# Patient Record
Sex: Female | Born: 1955 | Race: White | Hispanic: No | Marital: Single | Smoking: Current some day smoker
Health system: Southern US, Community
[De-identification: ages and names within clinical notes are randomized; demographics above are authoritative.]

## PROBLEM LIST (undated history)

## (undated) DIAGNOSIS — H5462 Unqualified visual loss, left eye, normal vision right eye: Secondary | ICD-10-CM

## (undated) DIAGNOSIS — G43909 Migraine, unspecified, not intractable, without status migrainosus: Secondary | ICD-10-CM

## (undated) DIAGNOSIS — K859 Acute pancreatitis without necrosis or infection, unspecified: Secondary | ICD-10-CM

## (undated) DIAGNOSIS — R9431 Abnormal electrocardiogram [ECG] [EKG]: Secondary | ICD-10-CM

## (undated) DIAGNOSIS — K5792 Diverticulitis of intestine, part unspecified, without perforation or abscess without bleeding: Secondary | ICD-10-CM

## (undated) DIAGNOSIS — E278 Other specified disorders of adrenal gland: Secondary | ICD-10-CM

## (undated) DIAGNOSIS — I10 Essential (primary) hypertension: Secondary | ICD-10-CM

## (undated) HISTORY — DX: Diverticulitis of intestine, part unspecified, without perforation or abscess without bleeding: K57.92

## (undated) HISTORY — DX: Acute pancreatitis without necrosis or infection, unspecified: K85.90

## (undated) HISTORY — DX: Migraine, unspecified, not intractable, without status migrainosus: G43.909

## (undated) HISTORY — PX: TONSILLECTOMY AND ADENOIDECTOMY: SUR1326

## (undated) HISTORY — DX: Abnormal electrocardiogram (ECG) (EKG): R94.31

## (undated) HISTORY — DX: Unqualified visual loss, left eye, normal vision right eye: H54.62

## (undated) HISTORY — DX: Other specified disorders of adrenal gland: E27.8

## (undated) HISTORY — DX: Essential (primary) hypertension: I10

---

## 1999-06-30 HISTORY — PX: TOTAL ABDOMINAL HYSTERECTOMY W/ BILATERAL SALPINGOOPHORECTOMY: SHX83

## 2006-12-17 ENCOUNTER — Encounter: Payer: Self-pay | Admitting: Internal Medicine

## 2007-05-09 ENCOUNTER — Ambulatory Visit: Payer: Self-pay | Admitting: Internal Medicine

## 2007-05-09 DIAGNOSIS — R9431 Abnormal electrocardiogram [ECG] [EKG]: Secondary | ICD-10-CM | POA: Insufficient documentation

## 2007-05-09 DIAGNOSIS — R519 Headache, unspecified: Secondary | ICD-10-CM | POA: Insufficient documentation

## 2007-05-09 DIAGNOSIS — I10 Essential (primary) hypertension: Secondary | ICD-10-CM | POA: Insufficient documentation

## 2007-05-09 DIAGNOSIS — R51 Headache: Secondary | ICD-10-CM | POA: Insufficient documentation

## 2007-05-09 DIAGNOSIS — Z9089 Acquired absence of other organs: Secondary | ICD-10-CM | POA: Insufficient documentation

## 2007-05-09 DIAGNOSIS — F17200 Nicotine dependence, unspecified, uncomplicated: Secondary | ICD-10-CM | POA: Insufficient documentation

## 2007-05-09 DIAGNOSIS — Z9079 Acquired absence of other genital organ(s): Secondary | ICD-10-CM | POA: Insufficient documentation

## 2007-05-18 ENCOUNTER — Encounter (INDEPENDENT_AMBULATORY_CARE_PROVIDER_SITE_OTHER): Payer: Self-pay | Admitting: *Deleted

## 2007-06-30 HISTORY — PX: COLONOSCOPY: SHX174

## 2007-08-28 LAB — CONVERTED CEMR LAB: Potassium: 4.3 meq/L (ref 3.5–5.1)

## 2007-08-30 ENCOUNTER — Encounter (INDEPENDENT_AMBULATORY_CARE_PROVIDER_SITE_OTHER): Payer: Self-pay | Admitting: *Deleted

## 2007-11-11 ENCOUNTER — Telehealth (INDEPENDENT_AMBULATORY_CARE_PROVIDER_SITE_OTHER): Payer: Self-pay | Admitting: *Deleted

## 2007-11-11 ENCOUNTER — Ambulatory Visit: Payer: Self-pay | Admitting: Internal Medicine

## 2008-04-12 ENCOUNTER — Telehealth (INDEPENDENT_AMBULATORY_CARE_PROVIDER_SITE_OTHER): Payer: Self-pay | Admitting: *Deleted

## 2008-06-13 ENCOUNTER — Telehealth (INDEPENDENT_AMBULATORY_CARE_PROVIDER_SITE_OTHER): Payer: Self-pay | Admitting: *Deleted

## 2008-08-14 ENCOUNTER — Telehealth (INDEPENDENT_AMBULATORY_CARE_PROVIDER_SITE_OTHER): Payer: Self-pay | Admitting: *Deleted

## 2008-09-26 ENCOUNTER — Ambulatory Visit: Payer: Self-pay | Admitting: Internal Medicine

## 2008-09-30 LAB — CONVERTED CEMR LAB
Calcium: 9.7 mg/dL (ref 8.4–10.5)
Chloride: 102 meq/L (ref 96–112)
Cholesterol: 227 mg/dL — ABNORMAL HIGH (ref 0–200)
Creatinine, Ser: 0.8 mg/dL (ref 0.4–1.2)
Direct LDL: 144.6 mg/dL
Total CHOL/HDL Ratio: 4
Triglycerides: 132 mg/dL (ref 0.0–149.0)
VLDL: 26.4 mg/dL (ref 0.0–40.0)

## 2008-10-01 ENCOUNTER — Encounter (INDEPENDENT_AMBULATORY_CARE_PROVIDER_SITE_OTHER): Payer: Self-pay | Admitting: *Deleted

## 2008-10-03 ENCOUNTER — Telehealth (INDEPENDENT_AMBULATORY_CARE_PROVIDER_SITE_OTHER): Payer: Self-pay | Admitting: *Deleted

## 2009-02-04 ENCOUNTER — Telehealth (INDEPENDENT_AMBULATORY_CARE_PROVIDER_SITE_OTHER): Payer: Self-pay | Admitting: *Deleted

## 2009-05-30 ENCOUNTER — Emergency Department: Payer: Self-pay | Admitting: Emergency Medicine

## 2009-07-10 ENCOUNTER — Telehealth (INDEPENDENT_AMBULATORY_CARE_PROVIDER_SITE_OTHER): Payer: Self-pay | Admitting: *Deleted

## 2010-01-01 ENCOUNTER — Telehealth (INDEPENDENT_AMBULATORY_CARE_PROVIDER_SITE_OTHER): Payer: Self-pay | Admitting: *Deleted

## 2010-02-11 ENCOUNTER — Ambulatory Visit: Payer: Self-pay | Admitting: Internal Medicine

## 2010-02-11 DIAGNOSIS — J069 Acute upper respiratory infection, unspecified: Secondary | ICD-10-CM | POA: Insufficient documentation

## 2010-02-17 LAB — CONVERTED CEMR LAB
BUN: 16 mg/dL (ref 6–23)
Bilirubin, Direct: 0.1 mg/dL (ref 0.0–0.3)
CO2: 26 meq/L (ref 19–32)
Chloride: 101 meq/L (ref 96–112)
Cholesterol: 210 mg/dL — ABNORMAL HIGH (ref 0–200)
Creatinine, Ser: 0.8 mg/dL (ref 0.4–1.2)
Direct LDL: 143.1 mg/dL
Eosinophils Absolute: 0.1 10*3/uL (ref 0.0–0.7)
Glucose, Bld: 75 mg/dL (ref 70–99)
MCHC: 34.6 g/dL (ref 30.0–36.0)
MCV: 86.5 fL (ref 78.0–100.0)
Monocytes Absolute: 0.4 10*3/uL (ref 0.1–1.0)
Neutrophils Relative %: 66.4 % (ref 43.0–77.0)
Platelets: 365 10*3/uL (ref 150.0–400.0)
TSH: 1.45 microintl units/mL (ref 0.35–5.50)
Total Bilirubin: 0.4 mg/dL (ref 0.3–1.2)
Total Protein: 6.9 g/dL (ref 6.0–8.3)

## 2010-06-27 ENCOUNTER — Telehealth: Payer: Self-pay | Admitting: Internal Medicine

## 2010-06-27 DIAGNOSIS — K625 Hemorrhage of anus and rectum: Secondary | ICD-10-CM | POA: Insufficient documentation

## 2010-06-27 DIAGNOSIS — R109 Unspecified abdominal pain: Secondary | ICD-10-CM | POA: Insufficient documentation

## 2010-07-01 ENCOUNTER — Encounter: Payer: Self-pay | Admitting: Gastroenterology

## 2010-07-09 ENCOUNTER — Ambulatory Visit: Admit: 2010-07-09 | Payer: Self-pay | Admitting: Gastroenterology

## 2010-07-29 NOTE — Assessment & Plan Note (Signed)
Summary: CPX/FASTING//KN   Vital Signs:  Patient profile:   55 year old female Height:      63.75 inches (161.93 cm) Weight:      134.38 pounds (61.08 kg) BMI:     23.33 Temp:     97.8 degrees F (36.56 degrees C) oral Resp:     15 per minute BP sitting:   130 / 82  (left arm) Cuff size:   regular  Vitals Entered By: Brenton Grills MA (February 11, 2010 10:55 AM)  O2 Flow:  70    History of Present Illness: Christy Morgan is here for a physical; she is asymptomatic except for a recent URI  with bronchitis.  The patient reports scant  purulent nasal discharge and dry cough, but denies significant nasal congestion  or  earache.  The patient denies fever, dyspnea, and wheezing.  The patient denies itchy watery eyes, sneezing, and headache.  The patient denies the following risk factors for Strep sinusitis: facial pain or tooth pain.   Hypertension Follow-Up      The patient also presents for Hypertension follow-up.  The patient denies lightheadedness, urinary frequency, headaches, and fatigue.  The patient denies the following associated symptoms: chest pain, chest pressure, exercise intolerance, dyspnea, palpitations, syncope, leg edema, and pedal edema.  Compliance with medications (by patient report) has been near 100%.  The patient reports that dietary compliance has been good.  The patient reports exercising daily.  Adjunctive measures currently used by the patient include modified  salt restriction.  BP averages 130/80 @ home.  Current Medications (verified): 1)  Lisinopril 40 Mg  Tabs (Lisinopril) .... 1/2 Tab Once Daily 2)  Atenolol 50 Mg  Tabs (Atenolol) .... 1/2 Tab Qd  Allergies (verified): 1)  ! Codeine 2)  ! 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         Weight up minimally in past year.   Physical Exam  General:  well-nourished,in no acute distress; alert,appropriate and cooperative throughout examination Head:  Normocephalic and atraumatic without obvious abnormalities.  Eyes:  No corneal or conjunctival inflammation noted. EOMI. Perrla. Funduscopic exam benign, without hemorrhages, exudates or papilledema. Vision grossly  decreased OS Ears:  External ear exam shows no significant lesions or deformities.  Otoscopic examination reveals clear canals, tympanic membranes are intact bilaterally without bulging, retraction, inflammation or discharge. Hearing is grossly normal bilaterally. Nose:  External nasal examination shows no deformity or inflammation. Nasal mucosa are pink and moist without lesions or exudates. Mouth:  Oral mucosa and oropharynx without lesions or  exudates.  Teeth in good repair. Neck:  No deformities, masses, or tenderness noted. Lungs:  Normal respiratory effort, chest expands symmetrically. Lungs are clear to auscultation, no  crackles or wheezes. Heart:  regular rhythm, no murmur, no gallop, no rub, no JVD, no HJR, and bradycardia.   Abdomen:  Bowel sounds positive,abdomen soft and non-tender without masses, organomegaly or hernias noted. Rectal:  Colonoscopy will be scheduled in Winter as per her request Msk:  No deformity or scoliosis noted of thoracic or lumbar spine.   Pulses:  R and L carotid,radial,dorsalis pedis and posterior tibial pulses are full and equal bilaterally Extremities:  No clubbing, cyanosis, edema, or deformity noted with normal full range of motion of all joints.   Neurologic:  alert & oriented X3 and DTRs symmetrical and normal.   Skin:  Intact without suspicious lesions or rashes. deeply tanned Cervical Nodes:  No lymphadenopathy noted Axillary Nodes:  No palpable lymphadenopathy Psych:  memory intact for recent and remote, normally interactive, and good eye contact.     Impression & Recommendations:  Problem # 1:  ROUTINE GENERAL MEDICAL EXAM@HEALTH  CARE FACL (ICD-V70.0)  Orders: EKG w/ Interpretation (93000) Venipuncture (09811) TLB-Lipid Panel (80061-LIPID) TLB-BMP (Basic Metabolic Panel-BMET) (80048-METABOL) TLB-CBC Platelet - w/Differential (85025-CBCD) TLB-Hepatic/Liver Function Pnl (80076-HEPATIC) TLB-TSH (Thyroid Stimulating Hormone) (84443-TSH)  Problem # 2:  BRONCHITIS-ACUTE (ICD-466.0)  Her updated medication list for this problem includes:    Amoxicillin 500 Mg Caps (Amoxicillin) .Marland Kitchen... 1 three times a day  Problem # 3:  URI (ICD-465.9)  Problem # 4:  HYPERTENSION, ESSENTIAL NOS (ICD-401.9) Controlled Her updated medication list for this problem includes:    Lisinopril 40 Mg Tabs (Lisinopril) .Marland Kitchen... 1/2 tab once daily    Atenolol 50 Mg Tabs (Atenolol) .Marland Kitchen... 1/2 tab qd  Problem # 5:  NONDEPENDENT TOBACCO USE DISORDER (ICD-305.1) Risks discussed  Complete Medication List: 1)  Lisinopril 40 Mg Tabs (Lisinopril) .... 1/2 tab once daily 2)  Atenolol 50 Mg Tabs  (Atenolol) .... 1/2 tab qd 3)  Amoxicillin 500 Mg Caps (Amoxicillin) .Marland Kitchen.. 1 three times a day  Patient Instructions: 1)  Schedule your mammogram. 2)  Schedule a colonoscopy in the Winter  to help detect colon cancer as per the [facility] we discussed. Prescriptions: AMOXICILLIN 500 MG CAPS (AMOXICILLIN) 1 three times a day  #30 x 0   Entered and Authorized by:   Marga Melnick MD   Signed by:   Marga Melnick MD on 02/11/2010   Method used:   Print then Give to Patient   RxID:   [id number] ATENOLOL 50 MG  TABS (ATENOLOL) 1/2 tab qd  #90 x 1   Entered and Authorized by:   Marga Melnick MD   Signed by:   Marga Melnick MD on 02/11/2010   Method used:   Faxed to ...       [facility] [address] [phone]* (retail)       [address] [place], [place]  [address]       Ph: [phone]       Fax: [phone]   RxID:   [id number] LISINOPRIL 40 MG  TABS (LISINOPRIL) 1/2 tab once daily  #90 x 1   Entered and Authorized by:   Marga Melnick MD   Signed by:   Marga Melnick MD on 02/11/2010   Method used:   Faxed to ...       [facility] [address] [phone]* (retail)  [address] [place], [place]  [id number]       Ph: [phone]       Fax: [phone]   RxID:   [id number]   Appended Document: CPX/FASTING//KN

## 2010-07-29 NOTE — Progress Notes (Signed)
Summary: refill  Phone Note Refill Request Message from:  Fax from Pharmacy on [facility] [address] fax [phone]  Refills Requested: Medication #1:  ATENOLOL 50 MG  TABS 1/2 tab qd. Initial call taken by: Barb Merino,  July 10, 2009 3:52 PM    Prescriptions: ATENOLOL 50 MG  TABS (ATENOLOL) 1/2 tab qd  #90 x 0   Entered by:   Shonna Chock   Authorized by:   Marga Melnick MD   Signed by:   Shonna Chock on 07/10/2009   Method used:   Electronically to        [facility] [address] [phone]* (retail)       [address] [place], [place]  [id number]       Ph: [phone]       Fax: [phone]   RxID:   [id number]

## 2010-07-29 NOTE — Progress Notes (Signed)
Summary: REFILL REQUEST  Phone Note Refill Request Message from:  Patient on January 01, 2010 3:06 PM  Refills Requested: Medication #1:  ATENOLOL 50 MG  TABS 1/2 tab qd.   Last Refilled: 07/10/2009  Medication #2:  LISINOPRIL 40 MG  TABS 1/2 tab once daily   Last Refilled: 02/04/2009 [facility] [address]  Next Appointment Scheduled: AUGUST 16TH 2011 Initial call taken by: Lavell Islam,  January 01, 2010 3:08 PM    Prescriptions: ATENOLOL 50 MG  TABS (ATENOLOL) 1/2 tab qd  #90 x 0   Entered by:   Shonna Chock   Authorized by:   Marga Melnick MD   Signed by:   Shonna Chock on 01/01/2010   Method used:   Electronically to        [facility] [address] [phone]* (retail)       [address] [place], [place]  [address]       Ph: [phone]       Fax: [phone]   RxID:   [id number] LISINOPRIL 40 MG  TABS (LISINOPRIL) 1/2 tab once daily  #90 x 0   Entered by:   Shonna Chock   Authorized by:   Marga Melnick MD   Signed by:   Shonna Chock on 01/01/2010   Method used:   Electronically to        [facility]. [address] [phone]* (retail)       [address] [place], [place]  [address]       Ph: [phone]       Fax: [phone]   RxID:   [id number]

## 2010-07-31 ENCOUNTER — Encounter: Payer: Self-pay | Admitting: Internal Medicine

## 2010-07-31 NOTE — Progress Notes (Signed)
Summary: referral  Phone Note Call from Patient   Caller: Patient Summary of Call: Pt left VM c/o increasing intestinal cramping/pain on left side of abdomen, and bright red blood in stool x3days. Pt denies any fever, abdominal swelling/tenderness, nausea or vomiting. Pt currently out of town but advise local UC/ED. Pt ok. Pt would also like to be referred to a GI docotor. Pls advise........Marland KitchenFelecia Deloach CMA  June 27, 2010 2:52 PM   Follow-up for Phone Call        It is critical she be seen because of pain & rectal bleeding. She needs to collect any records & bring these back for GI specialist to review. Follow-up by: Marga Melnick MD,  June 27, 2010 4:21 PM  Additional Follow-up for Phone Call Additional follow up Details #1::        I spoke with patient and she said feels better and she is in ,  in a strange place, has her dog and really just in a complicated situation. Patient indicated she will monitor herself and if symptoms increase or change she promise she will go to Urgent care or the Emergency Room and get record of anything.   Patient would like an order to be placed for GI, Dr.Hopper would you like referral placed now? Additional Follow-up by: Shonna Chock CMA,  June 27, 2010 4:26 PM  New Problems: RECTAL BLEEDING (ICD-569.3) ABDOMINAL PAIN (ICD-789.00)   Additional Follow-up for Phone Call Additional follow up Details #2::    see Order Follow-up by: Marga Melnick MD,  June 27, 2010 4:36 PM  Additional Follow-up for Phone Call Additional follow up Details #3:: Details for Additional Follow-up Action Taken: Patient has appt with Dr. Russella Dar of [facility] on 07-09-2010 & patient is aware. Magdalen Spatz [facility]  July 01, 2010 1:56 PM   New Problems: RECTAL BLEEDING (ICD-569.3) ABDOMINAL PAIN (ICD-789.00)

## 2010-07-31 NOTE — Letter (Signed)
Summary: New Patient letter  [facility]  [address] [place], [place] [address]   Phone: [phone]  Fax: [phone]       07/01/2010 MRN: [id number]  Christy Morgan [address] [place], [place]  [address]  Dear Ms. Longley,  Welcome to the Gastroenterology Division at [facility].    You are scheduled to see Dr.  Russella Dar on 07-09-10 at 10am on the 3rd floor at [facility] [place], [address]  We ask that you try to arrive at our office 15 minutes prior to your appointment time to allow for check-in.  We would like you to complete the enclosed self-administered evaluation form prior to your visit and bring it with you on the day of your appointment.  We will review it with you.  Also, please bring a complete list of all your medications or, if you prefer, bring the medication bottles and we will list them.  Please bring your insurance card so that we may make a copy of it.  If your insurance requires a referral to see a specialist, please bring your referral form from your primary care physician.  Co-payments are due at the time of your visit and may be paid by cash, check or credit card.     Your office visit will consist of a consult with your physician (includes a physical exam), any laboratory testing he/she may order, scheduling of any necessary diagnostic testing (e.g. x-ray, ultrasound, CT-scan), and scheduling of a procedure (e.g. Endoscopy, Colonoscopy) if required.  Please allow enough time on your schedule to allow for any/all of these possibilities.    If you cannot keep your appointment, please call [phone] to cancel or reschedule prior to your appointment date.  This allows [place] the opportunity to schedule an appointment for another patient in need of care.  If you do not cancel or reschedule by 5 p.m. the business day prior to your appointment date, you will be charged a $50.00 late cancellation/no-show fee.    Thank you for choosing [facility] for your medical needs.  We appreciate the opportunity to care for you.  Please visit [place] at our website  to learn more about our practice.                     Sincerely,                                                             The Gastroenterology Division

## 2010-08-14 NOTE — Letter (Signed)
Summary: [facility]   Imported By: Maryln Gottron 08/08/2010 09:38:49  _____________________________________________________________________  External Attachment:    Type:   Image     Comment:   External Document

## 2011-01-28 DIAGNOSIS — K5792 Diverticulitis of intestine, part unspecified, without perforation or abscess without bleeding: Secondary | ICD-10-CM

## 2011-01-28 HISTORY — DX: Diverticulitis of intestine, part unspecified, without perforation or abscess without bleeding: K57.92

## 2011-02-04 ENCOUNTER — Encounter (HOSPITAL_BASED_OUTPATIENT_CLINIC_OR_DEPARTMENT_OTHER): Payer: Self-pay

## 2011-02-04 ENCOUNTER — Encounter: Payer: Self-pay | Admitting: Family

## 2011-02-04 ENCOUNTER — Telehealth: Payer: Self-pay | Admitting: Family

## 2011-02-04 ENCOUNTER — Ambulatory Visit (INDEPENDENT_AMBULATORY_CARE_PROVIDER_SITE_OTHER): Payer: BC Managed Care – PPO | Admitting: Family

## 2011-02-04 ENCOUNTER — Ambulatory Visit (HOSPITAL_BASED_OUTPATIENT_CLINIC_OR_DEPARTMENT_OTHER)
Admission: RE | Admit: 2011-02-04 | Discharge: 2011-02-04 | Disposition: A | Discharge status: Home / Self Care | Payer: BC Managed Care – PPO | Source: Ambulatory Visit | Attending: Family | Admitting: Family

## 2011-02-04 VITALS — BP 110/86 | HR 66 | Temp 97.5°F | Resp 16 | Wt 139.0 lb

## 2011-02-04 DIAGNOSIS — N390 Urinary tract infection, site not specified: Secondary | ICD-10-CM

## 2011-02-04 DIAGNOSIS — E278 Other specified disorders of adrenal gland: Secondary | ICD-10-CM | POA: Insufficient documentation

## 2011-02-04 DIAGNOSIS — K5792 Diverticulitis of intestine, part unspecified, without perforation or abscess without bleeding: Secondary | ICD-10-CM

## 2011-02-04 DIAGNOSIS — E279 Disorder of adrenal gland, unspecified: Secondary | ICD-10-CM

## 2011-02-04 DIAGNOSIS — K5732 Diverticulitis of large intestine without perforation or abscess without bleeding: Secondary | ICD-10-CM

## 2011-02-04 DIAGNOSIS — I708 Atherosclerosis of other arteries: Secondary | ICD-10-CM | POA: Insufficient documentation

## 2011-02-04 DIAGNOSIS — R1032 Left lower quadrant pain: Secondary | ICD-10-CM | POA: Insufficient documentation

## 2011-02-04 DIAGNOSIS — I7090 Unspecified atherosclerosis: Secondary | ICD-10-CM

## 2011-02-04 DIAGNOSIS — K5730 Diverticulosis of large intestine without perforation or abscess without bleeding: Secondary | ICD-10-CM | POA: Insufficient documentation

## 2011-02-04 DIAGNOSIS — R109 Unspecified abdominal pain: Secondary | ICD-10-CM

## 2011-02-04 DIAGNOSIS — I70209 Unspecified atherosclerosis of native arteries of extremities, unspecified extremity: Secondary | ICD-10-CM | POA: Insufficient documentation

## 2011-02-04 HISTORY — DX: Other specified disorders of adrenal gland: E27.8

## 2011-02-04 LAB — POCT URINALYSIS DIPSTICK
Ketones, UA: NEGATIVE
Protein, UA: NEGATIVE
pH, UA: 5

## 2011-02-04 MED ORDER — METRONIDAZOLE 500 MG PO TABS: 500.0000 mg | ORAL_TABLET | Freq: Three times a day (TID) | ORAL | Status: AC

## 2011-02-04 MED ORDER — CIPROFLOXACIN HCL 500 MG PO TABS: 500.0000 mg | ORAL_TABLET | Freq: Two times a day (BID) | ORAL | Status: AC

## 2011-02-04 MED ORDER — IOHEXOL 300 MG/ML  SOLN
100.0000 mL | Freq: Once | INTRAMUSCULAR | Status: AC | PRN
  Administered 2011-02-04: 100 mL via INTRAVENOUS

## 2011-02-04 NOTE — Progress Notes (Signed)
  Subjective:    Patient ID: Christy Morgan, female    DOB: Dec 20, 1955, 55 y.o.   MRN: [id number]  HPI  Ms.  Morgan is a 55 yr old female who presents today with chief complaint of abdominal pain.  Symptoms started 2 days ago.  Pain is mostly noted on the left lower abdomen.  Notes some associated cramping.  She saw a small amount of blood "tinged" on the stool this AM.  Denies associated fever or constipation.  Pain is worst in the morning.  She reports previous history of similar symptoms which was helped by use of antibiotics.  Review of Systems See HPI     Past Medical History  Diagnosis Date  . Pancreatitis   . 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 Migraines   . HTN (hypertension)   . Abnormal EKG     History   Social History  . Marital Status: Single    Spouse Name: N/A    Number of Children: N/A  . Years of Education: N/A   Occupational History  . Not on file.   Social History Main Topics  . Smoking status: Current Some Day Smoker    Types: Cigarettes  . Smokeless tobacco: Never Used  . Alcohol Use: Not on file  . Drug Use: Not on file  . Sexually Active: Not on file   Other Topics Concern  . Not on file   Social History Narrative  . No narrative on file    No past surgical history on file.  No family history on file.  Allergies  Allergen Reactions  . Codeine   . Hydrochlorothiazide     REACTION: patient got pancreatitis from this medication    No current outpatient prescriptions on file prior to visit.    BP 110/86  Pulse 66  Temp(Src) 97.5 F (36.4 C) (Oral)  Resp 16  Wt 139 lb 0.6 oz (63.068 kg)  LMP 06/30/1999    Objective:   Physical Exam Gen: very suntanned caucasian female, awake, alert and in NAD Neck: supple, no masses CV: S1/S2, RRR, no murmur Resp: BS CTA bilaterally without wheezes, rales or rhonchi Abd: mildly distended.  + LLQ tenderness to palpation without guarding or tenderness.   Ext: no peripheral  edema.       Assessment & Plan:

## 2011-02-04 NOTE — Patient Instructions (Signed)
Please complete your CT on the first floor. Call if you develop recurrent bloody stools, if fever over 101 or if worsening abdominal pain. Let us know if you are not feeling better in 2-3 days. Try to keep to a full liquid diet the next 2-3 days, then you may advance to a low residue diet (see handout) until you complete the antibiotics.  Follow up with Dr. Alwyn Ren in 1 week.

## 2011-02-04 NOTE — Telephone Encounter (Signed)
Reviewed CT results with patient.  Recommended a 1 week follow up appointement with Dr. Alwyn Ren (sooner if problems or concerns) and smoking cessation.

## 2011-02-04 NOTE — Assessment & Plan Note (Signed)
CT notes incidental finding of a 1 cm left adrenal nodule which demonstrates a nonspecific density.  Monitor.

## 2011-02-04 NOTE — Assessment & Plan Note (Signed)
CT abd/pelvis performed today confirms diverticulitis. Dietary recommendations outlined in pt instructions. Plan 10 day treatment with cipro/flagyl.  Case was reviewed with Dr. Alwyn Ren.  Pt has been instructed to follow up with Dr. Alwyn Ren in 1 week, sooner if symptoms worsen or do not improve.

## 2011-02-04 NOTE — Assessment & Plan Note (Signed)
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  Pt denies LE pain except for hip and ankle pain due to orthopedic injuries.  Pt smokes and was advised on tobacco cessation.

## 2011-02-09 ENCOUNTER — Encounter: Payer: Self-pay | Admitting: Internal Medicine

## 2011-02-09 ENCOUNTER — Ambulatory Visit (INDEPENDENT_AMBULATORY_CARE_PROVIDER_SITE_OTHER): Payer: BC Managed Care – PPO | Admitting: Internal Medicine

## 2011-02-09 VITALS — BP 110/68 | HR 72 | Wt 137.0 lb

## 2011-02-09 DIAGNOSIS — K5792 Diverticulitis of intestine, part unspecified, without perforation or abscess without bleeding: Secondary | ICD-10-CM

## 2011-02-09 DIAGNOSIS — K5732 Diverticulitis of large intestine without perforation or abscess without bleeding: Secondary | ICD-10-CM

## 2011-02-09 DIAGNOSIS — F411 Generalized anxiety disorder: Secondary | ICD-10-CM

## 2011-02-09 DIAGNOSIS — F419 Anxiety disorder, unspecified: Secondary | ICD-10-CM

## 2011-02-09 MED ORDER — CITALOPRAM HYDROBROMIDE 20 MG PO TABS: 20.0000 mg | ORAL_TABLET | Freq: Every day | ORAL | Status: DC

## 2011-02-09 NOTE — Progress Notes (Signed)
  Subjective:    Patient ID: Christy Morgan, female    DOB: 1955-09-27, 55 y.o.   MRN: [id number]  HPI the abdominal pain has decreased from a peak of 9 or 10 scale to 1. She denies fever, chills, or sweats. She has no changes in her bowels at this time. She has approximately one week left of the medicines for diverticulitis.    Review of Systems she describes increased stress and questions the need for medication     Objective:   Physical Exam she is thin and well-nourished; she is in no acute distress  Thyroid is full; no nodules palpated  Her skin is darkly tanned  Chest is clear to auscultation. She has a regular rhythm without murmur gallop  Abdomen is nontender without organomegaly or masses  She has no lymphadenopathy that he had, neck or axilla  Clinically she is not anxious; interactions appropriate.       Assessment & Plan:  #1 diverticulitis, clinically improved  #2 exogenous stress, job related  #3 deep tanning of the skin;incidental adrenal adenoma on CT scan 8/12. Doubt Addison's. Most recent labs reviewed; potassium was 4.8 in August 2011  #4 past history of rectal bleeding; colonoscopy is overdue.

## 2011-02-09 NOTE — Patient Instructions (Signed)
Please check with your insurance to see if they will cover the colonoscopy at [facility] gastro[facility].  The adrenal adenoma will need to be monitored at least annually. Please schedule a fasting BMET cortisol at your convenience.

## 2011-03-10 ENCOUNTER — Other Ambulatory Visit: Payer: Self-pay | Admitting: Internal Medicine

## 2011-03-10 MED ORDER — ATENOLOL 50 MG PO TABS: ORAL_TABLET | ORAL | Status: DC

## 2011-03-10 MED ORDER — LISINOPRIL 40 MG PO TABS: ORAL_TABLET | ORAL | Status: DC

## 2011-03-10 NOTE — Telephone Encounter (Signed)
RX sent

## 2011-06-29 ENCOUNTER — Encounter: Payer: BC Managed Care – PPO | Admitting: Internal Medicine

## 2011-09-14 ENCOUNTER — Telehealth: Payer: Self-pay | Admitting: Internal Medicine

## 2011-09-14 DIAGNOSIS — F419 Anxiety disorder, unspecified: Secondary | ICD-10-CM

## 2011-09-14 MED ORDER — CITALOPRAM HYDROBROMIDE 20 MG PO TABS: 20.0000 mg | ORAL_TABLET | Freq: Every day | ORAL | Status: DC

## 2011-09-14 NOTE — Telephone Encounter (Signed)
Refill for  Citalopram HBR (Tab) 20 MG  Qty 30 Take 1 tablet (20 mg total) by mouth daily.  Last fill date 2.12.13

## 2011-09-14 NOTE — Telephone Encounter (Signed)
RX sent

## 2011-12-29 ENCOUNTER — Telehealth: Payer: Self-pay | Admitting: Internal Medicine

## 2011-12-29 ENCOUNTER — Other Ambulatory Visit: Payer: Self-pay | Admitting: *Deleted

## 2011-12-29 MED ORDER — LISINOPRIL 40 MG PO TABS: ORAL_TABLET | ORAL | Status: DC

## 2011-12-29 MED ORDER — ATENOLOL 50 MG PO TABS: ORAL_TABLET | ORAL | Status: DC

## 2011-12-29 NOTE — Telephone Encounter (Signed)
Refill: Lisinopril 40mg  tablet. Take 1/2 tablet by mouth daily. Qty 45. Last fill 10-05-11 Atenolol 50mg  tablet. Take 1/2 tablet by mouth daily. Qty 45. Last fill 10-05-11

## 2011-12-30 NOTE — Telephone Encounter (Signed)
I called the pharmacy and rx's was received already

## 2012-03-14 ENCOUNTER — Other Ambulatory Visit: Payer: Self-pay | Admitting: Internal Medicine

## 2012-03-14 NOTE — Telephone Encounter (Signed)
Patient needs to schedule  CPX  

## 2012-04-13 ENCOUNTER — Other Ambulatory Visit: Payer: Self-pay | Admitting: Internal Medicine

## 2012-04-13 MED ORDER — CITALOPRAM HYDROBROMIDE 20 MG PO TABS: ORAL_TABLET | ORAL | Status: DC

## 2012-04-13 NOTE — Telephone Encounter (Signed)
refill Citalopram 20 MG TAKE 1 TABLET BY MOUTH DAILY. #30---last fill 9.16.13  last ov 8.13.12 acute NOTE pt has scheduled CPE for 1.24.14

## 2012-06-17 ENCOUNTER — Telehealth: Payer: Self-pay | Admitting: Internal Medicine

## 2012-06-17 MED ORDER — ATENOLOL 50 MG PO TABS: ORAL_TABLET | ORAL | Status: DC

## 2012-06-17 MED ORDER — LISINOPRIL 40 MG PO TABS: ORAL_TABLET | ORAL | Status: DC

## 2012-06-17 NOTE — Telephone Encounter (Signed)
Refill: Atenolol 50 mg tablet. Take 1/2 tablet by mouth daily. Qty 45. Last fill 03-14-12 Lisinopril 40 mg tablet. Take 1/2 tablet by mouth daily. Qty 45. Last fill 03-14-12

## 2012-07-18 ENCOUNTER — Other Ambulatory Visit: Payer: Self-pay | Admitting: Internal Medicine

## 2012-07-18 MED ORDER — CITALOPRAM HYDROBROMIDE 20 MG PO TABS: ORAL_TABLET | ORAL | Status: DC

## 2012-07-18 NOTE — Telephone Encounter (Signed)
RX sent

## 2012-07-18 NOTE — Telephone Encounter (Signed)
refill Citalopram HBR (Tab) 20 MG TAKE 1 TABLET BY MOUTH DAILY #30 wt/2-refills last fill 12.16.13 cpe scheduled for 1.27.14

## 2012-07-25 ENCOUNTER — Ambulatory Visit (INDEPENDENT_AMBULATORY_CARE_PROVIDER_SITE_OTHER): Payer: BC Managed Care – PPO | Admitting: Internal Medicine

## 2012-07-25 ENCOUNTER — Encounter: Payer: Self-pay | Admitting: Internal Medicine

## 2012-07-25 VITALS — BP 126/72 | HR 62 | Temp 97.8°F | Resp 14 | Ht 63.03 in | Wt 143.2 lb

## 2012-07-25 DIAGNOSIS — Z0000 Encounter for general adult medical examination without abnormal findings: Secondary | ICD-10-CM

## 2012-07-25 DIAGNOSIS — I10 Essential (primary) hypertension: Secondary | ICD-10-CM

## 2012-07-25 LAB — CBC WITH DIFFERENTIAL/PLATELET
Basophils Absolute: 0.1 10*3/uL (ref 0.0–0.1)
Eosinophils Relative: 0.4 % (ref 0.0–5.0)
Lymphocytes Relative: 24.9 % (ref 12.0–46.0)
Monocytes Relative: 4.2 % (ref 3.0–12.0)
Neutrophils Relative %: 69.8 % (ref 43.0–77.0)
Platelets: 319 10*3/uL (ref 150.0–400.0)
WBC: 7.4 10*3/uL (ref 4.5–10.5)

## 2012-07-25 LAB — BASIC METABOLIC PANEL
BUN: 18 mg/dL (ref 6–23)
Calcium: 9.2 mg/dL (ref 8.4–10.5)
Creatinine, Ser: 0.9 mg/dL (ref 0.4–1.2)
GFR: 72.78 mL/min (ref >60.00)
Potassium: 4.4 mEq/L (ref 3.5–5.1)

## 2012-07-25 LAB — HEPATIC FUNCTION PANEL
Albumin: 4 g/dL (ref 3.5–5.2)
Alkaline Phosphatase: 83 U/L (ref 39–117)

## 2012-07-25 LAB — LIPID PANEL
Cholesterol: 255 mg/dL — ABNORMAL HIGH (ref 0–200)
HDL: 52.2 mg/dL (ref >39.00)
Triglycerides: 165 mg/dL — ABNORMAL HIGH (ref 0.0–149.0)
VLDL: 33 mg/dL (ref 0.0–40.0)

## 2012-07-25 MED ORDER — LISINOPRIL 40 MG PO TABS: ORAL_TABLET | ORAL | Status: DC

## 2012-07-25 MED ORDER — ATENOLOL 50 MG PO TABS: ORAL_TABLET | ORAL | Status: DC

## 2012-07-25 MED ORDER — CITALOPRAM HYDROBROMIDE 20 MG PO TABS: ORAL_TABLET | ORAL | Status: DC

## 2012-07-25 NOTE — Progress Notes (Signed)
  Subjective:    Patient ID: Christy Morgan, female    DOB: 1956-04-01, 57 y.o.   MRN: [id number]  HPI  Christy Morgan is here for a physical; she denies acute issues except L hip pain related to remote fracture. Occasionally she has some sciatic component as well.      Review of Systems CHRONIC HYPERTENSION follow-up: Home blood pressure  average 128/80+ Patient is compliant with medications No adverse effects noted from medication Vigorous exercise program as riding daily for 120 minutes. No specific dietary program but increased vegetables & no added salt @ table.  No chest pain, palpitations, dyspnea, claudication,edema or paroxysmal nocturnal dyspnea described. No significant lightheadedness, headache, epistaxis, or syncope.           Objective:   Physical Exam Gen.: Well-nourished in appearance. Alert, appropriate and cooperative throughout exam. Appears younger than stated age  Head: Normocephalic without obvious abnormalities  Eyes: No corneal or conjunctival inflammation noted. Pupils equal round reactive to light and accommodation. Extraocular motion intact. OD contact. Ears: External  ear exam reveals no significant lesions or deformities. Canals clear .TMs normal. Hearing is grossly normal bilaterally. Nose: External nasal exam reveals no deformity or inflammation. Nasal mucosa are pink and moist. No lesions or exudates noted.  Mouth: Oral mucosa and oropharynx reveal no lesions or exudates. Teeth in good repair. Neck: No deformities, masses, or tenderness noted. Range of motion & Thyroid normal. Lungs: Normal respiratory effort; chest expands symmetrically. Lungs are clear to auscultation without rales, wheezes, or increased work of breathing. Heart: Normal rate and rhythm. Normal S1 and S2. No gallop, click, or rub. No murmur. Abdomen: Bowel sounds normal; abdomen soft and nontender. No masses, organomegaly or hernias noted. Genitalia: to see Dr Vincente Poli                                   Musculoskeletal/extremities: There is slight asymmetry of the posterior thoracic musculature suggesting occult scoliosis. No clubbing, cyanosis, edema, or significant extremity  deformity noted. Range of motion normal .Tone & strength  normal.Joints normal. Nail health good. Able to lie down & sit up w/o help. Negative SLR bilaterally Vascular: Carotid & radial artery  are full and equal.Slightly decreased dorsalis pedis and  posterior tibial pulses. No bruits present. Neurologic: Alert and oriented x3. Deep tendon reflexes symmetrical and normal. Skin: Intact without suspicious lesions or rashes.deeply tanned Lymph: No cervical, axillary lymphadenopathy present. Psych: Mood and affect are normal. Normally interactive                                                                                         Assessment & Plan:  #1 comprehensive physical exam; no acute findings #2 HTN controlled Plan: see Orders

## 2012-07-25 NOTE — Patient Instructions (Addendum)
Preventive Health Care: Health Care Power of Attorney & Living Will place you in charge of your health care  decisions. Verify these are  in place. Minimal Blood Pressure Goal= AVERAGE < 140/90;  Ideal is an AVERAGE < 135/85. This AVERAGE should be calculated from @ least 5-7 BP readings taken @ different times of day on different days of week. You should not respond to isolated BP readings , but rather the AVERAGE for that week .Please bring your  blood pressure cuff to office visits to verify that it is reliable.It  can also be checked against the blood pressure device at the pharmacy. Finger or wrist cuffs are not dependable; an arm cuff is.  As per the Standard of Care , screening Colonoscopy recommended @ 50 & every 5-10 years thereafter . More frequent monitor would be dictated by family history or findings @ Colonoscopy. If you activate My Chart; the results can be released to you as soon as they populate from the lab. If you choose not to use this program; the labs have to be reviewed, copied & mailed   causing a delay in getting the results to you.

## 2012-07-27 ENCOUNTER — Encounter: Payer: Self-pay | Admitting: *Deleted

## 2013-03-01 ENCOUNTER — Other Ambulatory Visit: Payer: Self-pay | Admitting: Internal Medicine

## 2013-03-01 NOTE — Telephone Encounter (Signed)
Med filled. Pt due for OV not seen since 06/2012.

## 2013-03-07 ENCOUNTER — Other Ambulatory Visit: Payer: Self-pay | Admitting: Internal Medicine

## 2013-06-19 ENCOUNTER — Other Ambulatory Visit: Payer: Self-pay | Admitting: Internal Medicine

## 2013-06-20 NOTE — Telephone Encounter (Signed)
Atenolol, Citalopram and Lisinopril refilled per protocol. JG//CMA

## 2013-08-02 HISTORY — PX: OTHER SURGICAL HISTORY: SHX169

## 2013-09-20 ENCOUNTER — Other Ambulatory Visit: Payer: Self-pay | Admitting: Internal Medicine

## 2013-10-09 ENCOUNTER — Telehealth: Payer: Self-pay | Admitting: Internal Medicine

## 2013-10-09 ENCOUNTER — Ambulatory Visit (INDEPENDENT_AMBULATORY_CARE_PROVIDER_SITE_OTHER)
Admission: RE | Admit: 2013-10-09 | Discharge: 2013-10-09 | Disposition: A | Discharge status: Home / Self Care | Payer: BC Managed Care – PPO | Source: Ambulatory Visit | Attending: Internal Medicine | Admitting: Internal Medicine

## 2013-10-09 ENCOUNTER — Encounter: Payer: Self-pay | Admitting: Internal Medicine

## 2013-10-09 ENCOUNTER — Ambulatory Visit (INDEPENDENT_AMBULATORY_CARE_PROVIDER_SITE_OTHER): Payer: BC Managed Care – PPO | Admitting: Internal Medicine

## 2013-10-09 ENCOUNTER — Other Ambulatory Visit (INDEPENDENT_AMBULATORY_CARE_PROVIDER_SITE_OTHER): Payer: BC Managed Care – PPO

## 2013-10-09 ENCOUNTER — Other Ambulatory Visit: Payer: Self-pay | Admitting: Internal Medicine

## 2013-10-09 VITALS — BP 118/80 | HR 53 | Temp 96.8°F | Ht 62.5 in | Wt 109.0 lb

## 2013-10-09 DIAGNOSIS — E785 Hyperlipidemia, unspecified: Secondary | ICD-10-CM | POA: Insufficient documentation

## 2013-10-09 DIAGNOSIS — Z0000 Encounter for general adult medical examination without abnormal findings: Secondary | ICD-10-CM

## 2013-10-09 DIAGNOSIS — F17200 Nicotine dependence, unspecified, uncomplicated: Secondary | ICD-10-CM

## 2013-10-09 LAB — CBC WITH DIFFERENTIAL/PLATELET
BASOS PCT: 0.4 % (ref 0.0–3.0)
Basophils Absolute: 0 10*3/uL (ref 0.0–0.1)
EOS PCT: 0.5 % (ref 0.0–5.0)
Eosinophils Absolute: 0 10*3/uL (ref 0.0–0.7)
HEMATOCRIT: 41.4 % (ref 36.0–46.0)
HEMOGLOBIN: 14 g/dL (ref 12.0–15.0)
LYMPHS ABS: 1.9 10*3/uL (ref 0.7–4.0)
Lymphocytes Relative: 24.2 % (ref 12.0–46.0)
MCHC: 33.7 g/dL (ref 30.0–36.0)
MCV: 87 fl (ref 78.0–100.0)
MONO ABS: 0.3 10*3/uL (ref 0.1–1.0)
Monocytes Relative: 3.9 % (ref 3.0–12.0)
Neutro Abs: 5.6 10*3/uL (ref 1.4–7.7)
Neutrophils Relative %: 71 % (ref 43.0–77.0)
Platelets: 334 10*3/uL (ref 150.0–400.0)
RBC: 4.76 Mil/uL (ref 3.87–5.11)
RDW: 15.6 % — ABNORMAL HIGH (ref 11.5–14.6)
WBC: 7.8 10*3/uL (ref 4.5–10.5)

## 2013-10-09 LAB — BASIC METABOLIC PANEL
BUN: 10 mg/dL (ref 6–23)
CHLORIDE: 99 meq/L (ref 96–112)
CO2: 26 mEq/L (ref 19–32)
Calcium: 9.3 mg/dL (ref 8.4–10.5)
Creatinine, Ser: 0.8 mg/dL (ref 0.4–1.2)
GFR: 76.56 mL/min (ref >60.00)
Glucose, Bld: 58 mg/dL — ABNORMAL LOW (ref 70–99)
POTASSIUM: 4 meq/L (ref 3.5–5.1)
SODIUM: 134 meq/L — AB (ref 135–145)

## 2013-10-09 LAB — HEPATIC FUNCTION PANEL
ALT: 12 U/L (ref 0–35)
AST: 16 U/L (ref 0–37)
Albumin: 4 g/dL (ref 3.5–5.2)
Alkaline Phosphatase: 76 U/L (ref 39–117)
BILIRUBIN TOTAL: 0.4 mg/dL (ref 0.3–1.2)
Bilirubin, Direct: 0.1 mg/dL (ref 0.0–0.3)
TOTAL PROTEIN: 7.5 g/dL (ref 6.0–8.3)

## 2013-10-09 LAB — TSH: TSH: 1.66 u[IU]/mL (ref 0.35–5.50)

## 2013-10-09 MED ORDER — CITALOPRAM HYDROBROMIDE 20 MG PO TABS: ORAL_TABLET | ORAL | Status: DC

## 2013-10-09 NOTE — Telephone Encounter (Signed)
Relevant patient education mailed to patient.  

## 2013-10-09 NOTE — Patient Instructions (Signed)
Your next office appointment will be determined based upon review of your pending labs & x-rays. Those instructions will be transmitted to you through My Chart . As per the Standard of Care , screening Colonoscopy recommended @ 50 & every 5-10 years thereafter . More frequent monitor would be dictated by family history or findings @ Colonoscopy

## 2013-10-09 NOTE — Progress Notes (Signed)
   Subjective:    Patient ID: Christy Morgan, female    DOB: [birth year], 57 y.o.   MRN: [id number]  HPI  She is here for a physical;acute issues denied.  Blood pressure range / average : 120/80s Compliant with anti hypertemsive medication. No lightheadedness or other adverse medication effect described.  A heart healthy /low salt diet is followed. Exercise encompasses 120 minutes 6  times per week as  riding without symptoms.  Family history is + for HTN in brother; no FH CVA         Review of Systems  Significant headaches, epistaxis, chest pain, palpitations, exertional dyspnea, claudication, paroxysmal nocturnal dyspnea, or edema absent.     Objective:   Physical Exam  Gen.: Thin but healthy and well-nourished in appearance. Alert, appropriate and cooperative throughout exam. Appears younger than stated age  Head: Normocephalic without obvious abnormalities  Eyes: No corneal or conjunctival inflammation noted. Pupils equal round reactive to light and accommodation. Extraocular motion intact. (ophth exam 12/14) Ears: External  ear exam reveals no significant lesions or deformities. Canals clear .TMs normal. Hearing is grossly normal bilaterally. Nose: External nasal exam reveals no deformity or inflammation. Nasal mucosa are pink and moist. No lesions or exudates noted.   Mouth: Oral mucosa and oropharynx reveal no lesions or exudates. Teeth in good repair. Neck: No deformities, masses, or tenderness noted. Range of motion &  Thyroid normal Lungs: Normal respiratory effort; chest expands symmetrically. Lungs are clear to auscultation without rales, wheezes, or increased work of breathing.DECREASED BS Heart: Normal rate and rhythm. Normal S1 and S2. No gallop, click, or rub. No murmur. Abdomen: Bowel sounds normal; abdomen soft and nontender. No masses, organomegaly or hernias noted.Aorta palpable ; no AAA Genitalia: as per Gyn                                    Musculoskeletal/extremities: No deformity or scoliosis noted of  the thoracic or lumbar spine.   Deformed L clavicle. No clubbing, cyanosis, edema, or significant extremity  deformity noted. Range of motion normal .Tone & strength normal. Hand joints normal Fingernail / toenail health good. Able to lie down & sit up w/o help. Negative SLR bilaterally Vascular: Carotid, radial artery, dorsalis pedis and  posterior tibial pulses are  equal. Decreased pedal pulses.No bruits present. Neurologic: Alert and oriented x3. Deep tendon reflexes symmetrical and normal.  Gait normal  including heel & toe walking . Rhomberg & finger to nose       Skin: Intact without suspicious lesions or rashes.Deeply tanned in solar distribution Lymph: No cervical, axillary lymphadenopathy present. Psych: Mood and affect are normal. Normally interactive                                                                                        Assessment & Plan:  #1 comprehensive physical exam; no acute findings  Plan: see Orders  & Recommendations

## 2013-10-09 NOTE — Progress Notes (Signed)
Pre visit review using our clinic review tool, if applicable. No additional management support is needed unless otherwise documented below in the visit note. 

## 2013-10-10 LAB — NMR LIPOPROFILE WITH LIPIDS
CHOLESTEROL, TOTAL: 223 mg/dL — AB (ref <200)
HDL Particle Number: 38.9 umol/L (ref >=30.5)
HDL Size: 9.5 nm (ref >=9.2)
HDL-C: 78 mg/dL (ref >=40)
LARGE HDL: 12.8 umol/L (ref >=4.8)
LDL (calc): 124 mg/dL — ABNORMAL HIGH (ref <100)
LDL Particle Number: 1646 nmol/L — ABNORMAL HIGH (ref <1000)
LDL Size: 21.3 nm (ref >20.5)
LP-IR Score: 30 (ref <=45)
Large VLDL-P: 3.2 nmol/L — ABNORMAL HIGH (ref <=2.7)
SMALL LDL PARTICLE NUMBER: 569 nmol/L — AB (ref <=527)
TRIGLYCERIDES: 105 mg/dL (ref <150)
VLDL Size: 45.6 nm (ref <=46.6)

## 2014-01-11 ENCOUNTER — Other Ambulatory Visit: Payer: Self-pay | Admitting: Internal Medicine

## 2014-06-25 ENCOUNTER — Ambulatory Visit: Payer: BC Managed Care – PPO | Admitting: Internal Medicine

## 2014-11-23 ENCOUNTER — Other Ambulatory Visit: Payer: Self-pay | Admitting: Internal Medicine

## 2014-11-23 ENCOUNTER — Other Ambulatory Visit: Payer: Self-pay

## 2014-11-23 MED ORDER — CITALOPRAM HYDROBROMIDE 20 MG PO TABS: ORAL_TABLET | ORAL | Status: DC

## 2014-11-23 NOTE — Telephone Encounter (Signed)
celexa rx sent to pharm 

## 2014-11-23 NOTE — Telephone Encounter (Signed)
Scheduled for CPE on 12/12/2014.

## 2014-12-12 ENCOUNTER — Ambulatory Visit (INDEPENDENT_AMBULATORY_CARE_PROVIDER_SITE_OTHER): Payer: No Typology Code available for payment source | Admitting: Internal Medicine

## 2014-12-12 ENCOUNTER — Encounter: Payer: Self-pay | Admitting: Internal Medicine

## 2014-12-12 VITALS — BP 132/84 | HR 56 | Temp 97.6°F | Resp 16 | Ht 63.0 in | Wt 114.0 lb

## 2014-12-12 DIAGNOSIS — Z0000 Encounter for general adult medical examination without abnormal findings: Secondary | ICD-10-CM | POA: Diagnosis not present

## 2014-12-12 DIAGNOSIS — I10 Essential (primary) hypertension: Secondary | ICD-10-CM

## 2014-12-12 DIAGNOSIS — E785 Hyperlipidemia, unspecified: Secondary | ICD-10-CM

## 2014-12-12 MED ORDER — ATENOLOL 50 MG PO TABS: 25.0000 mg | ORAL_TABLET | Freq: Every day | ORAL | Status: DC

## 2014-12-12 MED ORDER — CITALOPRAM HYDROBROMIDE 20 MG PO TABS: ORAL_TABLET | ORAL | Status: DC

## 2014-12-12 MED ORDER — LISINOPRIL 40 MG PO TABS: ORAL_TABLET | ORAL | Status: DC

## 2014-12-12 NOTE — Progress Notes (Signed)
Subjective:    Patient ID: Christy Morgan, female    DOB: 13-Feb-1956, 59 y.o.   MRN: [id number]  HPI She is here for a physical;acute issues denied.  She is on a modified heart healthy diet; she does not add salt. She has been compliant with her medications without adverse effects. Blood pressure averages 135/88-90. She is physically active 3 hours a day without associated cardiopulmonary symptoms.  As of March of this year she rarely smokes. She will have 2 alcoholic beverages per week.  The citalopram has been of significant benefit. She denies anxiety, irritability, triage issues, panic attacks, or depression. Appetite and sleep are good.  She had a colonoscopy in 2009 which was negative; standard of care reviewed   Review of Systems  Chest pain, palpitations, tachycardia, exertional dyspnea, paroxysmal nocturnal dyspnea, claudication or edema are absent. No unexplained weight loss, abdominal pain, significant dyspepsia, dysphagia, melena, rectal bleeding, or persistently small caliber stools. Dysuria, pyuria, hematuria, frequency, nocturia or polyuria are denied. Change in hair, skin, nails denied. No bowel changes of constipation or diarrhea. No intolerance to heat or cold.      Objective:   Physical Exam  Gen.: Adequately nourished in appearance. Alert, appropriate and cooperative throughout exam.Very deeply tanned.: Head: Normocephalic without obvious abnormalities  Eyes: No corneal or conjunctival inflammation noted. Pupils equal round reactive to light and accommodation. Extraocular motion intact.  Ears: External  ear exam reveals no significant lesions or deformities. Canals clear .TMs normal. Hearing is grossly normal bilaterally. Nose: External nasal exam reveals no deformity or inflammation. Nasal mucosa are pink and moist. No lesions or exudates noted.   Mouth: Oral mucosa and oropharynx reveal no lesions or exudates. Upper plate & lower partial Neck: No deformities,  masses, or tenderness noted. Range of motion & Thyroid normal Lungs: Breath sounds are decreased. Normal respiratory effort; chest expands symmetrically. Lungs are clear to auscultation without rales, wheezes, or increased work of breathing. Heart: Normal rate and rhythm. Normal S1 and S2. No gallop, click, or rub. No murmur. Abdomen: Bowel sounds normal; abdomen soft and nontender. No masses, organomegaly or hernias noted. Genitalia:  as per Gyn                                  Musculoskeletal/extremities: Deformed left clavicle. No deformity or scoliosis noted of  the thoracic or lumbar spine. No clubbing, cyanosis, edema, or significant extremity  deformity noted.  Range of motion normal . Tone & strength normal. Hand joints normal  Fingernail  health good, except for deformed right great thumb nail Able to lie down & sit up w/o help.  Negative SLR bilaterally Vascular: Carotid, radial artery, dorsalis pedis and  posterior tibial pulses are equal. Decreased pedal pulses. No bruits present. Neurologic: Alert and oriented x3. Deep tendon reflexes symmetrical and normal.  Gait normal       Skin: Intact without suspicious lesions or rashes. Lymph: No cervical, axillaryl lymphadenopathy present. Psych: Mood and affect are normal. Normally interactive  Assessment & Plan:  #1 comprehensive physical exam; no acute findings. Labs declined.  Plan: see Orders  & Recommendations

## 2014-12-12 NOTE — Progress Notes (Signed)
Pre visit review using our clinic review tool, if applicable. No additional management support is needed unless otherwise documented below in the visit note. 

## 2014-12-12 NOTE — Patient Instructions (Signed)

## 2015-07-10 ENCOUNTER — Telehealth: Payer: Self-pay | Admitting: Emergency Medicine

## 2015-07-10 NOTE — Telephone Encounter (Signed)
LVM for pt to call back and schedule appt with new PCP. Refill for Citalopram will be sent once Appt is made

## 2015-07-16 ENCOUNTER — Telehealth: Payer: Self-pay | Admitting: Behavioral Health

## 2015-07-16 ENCOUNTER — Ambulatory Visit: Payer: No Typology Code available for payment source | Admitting: Physician Assistant

## 2015-07-16 NOTE — Telephone Encounter (Signed)
Unable to reach patient at time of Pre-Visit Call.  Left message for patient to return call when available.    

## 2015-07-17 ENCOUNTER — Ambulatory Visit (INDEPENDENT_AMBULATORY_CARE_PROVIDER_SITE_OTHER): Payer: Self-pay | Admitting: Physician Assistant

## 2015-07-17 ENCOUNTER — Encounter: Payer: Self-pay | Admitting: Physician Assistant

## 2015-07-17 VITALS — BP 130/80 | HR 58 | Temp 97.5°F | Ht 63.0 in | Wt 122.6 lb

## 2015-07-17 DIAGNOSIS — F32A Depression, unspecified: Secondary | ICD-10-CM | POA: Insufficient documentation

## 2015-07-17 DIAGNOSIS — Z566 Other physical and mental strain related to work: Secondary | ICD-10-CM

## 2015-07-17 DIAGNOSIS — F329 Major depressive disorder, single episode, unspecified: Secondary | ICD-10-CM | POA: Insufficient documentation

## 2015-07-17 DIAGNOSIS — I10 Essential (primary) hypertension: Secondary | ICD-10-CM

## 2015-07-17 MED ORDER — CITALOPRAM HYDROBROMIDE 20 MG PO TABS: ORAL_TABLET | ORAL | Status: DC

## 2015-07-17 NOTE — Progress Notes (Signed)
Patient presents to clinic today to transfer care from Dr. Alwyn Ren who is retiring. Patient with history of hypertension, currently on Atenolol 25 mg and Lisinopril 40 mg daily. Endorses taking medications as directed. Patient denies chest pain, palpitations, lightheadedness, dizziness, vision changes or frequent headaches.  BP Readings from Last 3 Encounters:  07/17/15 130/80  12/12/14 132/84  10/09/13 118/80   Patient also on Citalopram 20 mg for stress/anxiety. Endorses doing well overall on this medication. Denies depressed mood or anhedonia. Denies SI/HI.  Past Medical History  Diagnosis Date  . Pancreatitis     from HCTZ  . 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 Migraines     PMH of with uncontrolled BP  . HTN (hypertension)   . Abnormal EKG   . Adrenal mass (HCC) 02/04/2011    incidental finding on CT for diverticulitis  . Diverticulitis 01/2011    Current Outpatient Prescriptions on File Prior to Visit  Medication Sig Dispense Refill  . atenolol (TENORMIN) 50 MG tablet Take 0.5 tablets (25 mg total) by mouth daily. 45 tablet 3  . lisinopril (PRINIVIL,ZESTRIL) 40 MG tablet TAKE 1/2 TABLET BY MOUTH ONCE DAILY. 45 tablet 3   No current facility-administered medications on file prior to visit.    Allergies  Allergen Reactions  . Hydrochlorothiazide     REACTION: patient got pancreatitis from this medication  . Codeine     Mental status changes    Family History  Problem Relation Age of Onset  . Heart disease Father     pacemaker  . Heart attack Paternal Grandfather     in 52s  . Cancer Neg Hx   . Stroke Neg Hx   . Diabetes Neg Hx   . Hypertension Brother     her twin    Social History   Social History  . Marital Status: Single    Spouse Name: N/A  . Number of Children: N/A  . Years of Education: N/A   Social History Main Topics  . Smoking status: Current Some Day Smoker -- 0.50 packs/day    Types: Cigarettes  . Smokeless tobacco: Never Used      Comment: smoked 1974- 08/2014 ,up to 1 ppd.Rarely as of 3/16  . Alcohol Use: 1.2 oz/week    2 Cans of beer per week     Comment: occasionally   . Drug Use: No  . Sexual Activity: Not Asked   Other Topics Concern  . None   Social History Narrative   Review of Systems - See HPI.  All other ROS are negative.  BP 130/80 mmHg  Pulse 58  Temp(Src) 97.5 F (36.4 C) (Oral)  Ht  (1.6 m)  Wt 122 lb 9.6 oz (55.611 kg)  BMI 21.72 kg/m2  SpO2 100%  LMP 06/30/1999  Physical Exam  Constitutional: She is oriented to person, place, and time and well-developed, well-nourished, and in no distress.  HENT:  Head: Normocephalic and atraumatic.  Eyes: Conjunctivae are normal.  Neck: Neck supple.  Cardiovascular: Normal rate, regular rhythm, normal heart sounds and intact distal pulses.   Pulmonary/Chest: Effort normal and breath sounds normal. No respiratory distress. She has no wheezes. She has no rales. She exhibits no tenderness.  Neurological: She is alert and oriented to person, place, and time.  Skin: Skin is warm and dry. No rash noted.  Psychiatric: Affect normal.  Vitals reviewed.  Assessment/Plan: Stress at work With anxiety. Has been well-controlled with Citalopram  20 mg daily. Will continue current medication regimen. Medications refilled. Will follow-up in 6 months for CPE  Essential hypertension BP stable today. Asymptomatic. Will continue current regimen. Will follow-up in June for CPE and fasting labs.

## 2015-07-17 NOTE — Progress Notes (Signed)
Pre visit review using our clinic review tool, if applicable. No additional management support is needed unless otherwise documented below in the visit note. 

## 2015-07-17 NOTE — Assessment & Plan Note (Signed)
BP stable today. Asymptomatic. Will continue current regimen. Will follow-up in June for CPE and fasting labs.

## 2015-07-17 NOTE — Assessment & Plan Note (Signed)
With anxiety. Has been well-controlled with Citalopram 20 mg daily. Will continue current medication regimen. Medications refilled. Will follow-up in 6 months for CPE

## 2015-07-17 NOTE — Patient Instructions (Signed)
Please continue your medications as directed. Follow-up with me in 6 months for your annual exam. Return sooner if you need me.

## 2015-12-30 ENCOUNTER — Telehealth: Payer: Self-pay | Admitting: Physician Assistant

## 2015-12-30 ENCOUNTER — Other Ambulatory Visit: Payer: Self-pay | Admitting: *Deleted

## 2015-12-30 DIAGNOSIS — I10 Essential (primary) hypertension: Secondary | ICD-10-CM

## 2015-12-30 MED ORDER — ATENOLOL 50 MG PO TABS: 25.0000 mg | ORAL_TABLET | Freq: Every day | ORAL | Status: DC

## 2015-12-30 MED ORDER — LISINOPRIL 40 MG PO TABS: 20.0000 mg | ORAL_TABLET | Freq: Every day | ORAL | Status: DC

## 2015-12-30 NOTE — Telephone Encounter (Signed)
Relation to WU:JWJXpt:self Call back number:[phone] [facility] [id number] [place], [place] - [address] [facility] [address] [phone] ([phone])         Reason for call:  Patient requesting a refill atenolol (TENORMIN) 50 MG tablet and lisinopril (PRINIVIL,ZESTRIL) 40 MG tablet

## 2015-12-30 NOTE — Telephone Encounter (Signed)
Refill sent per [facility] refill protocol/SLS  

## 2015-12-30 NOTE — Progress Notes (Signed)
Orders sent via escribe/SLS 07/03

## 2016-01-08 ENCOUNTER — Other Ambulatory Visit: Payer: Self-pay | Admitting: Internal Medicine

## 2016-01-15 ENCOUNTER — Encounter: Payer: Self-pay | Admitting: Physician Assistant

## 2016-01-15 ENCOUNTER — Ambulatory Visit (INDEPENDENT_AMBULATORY_CARE_PROVIDER_SITE_OTHER): Payer: PRIVATE HEALTH INSURANCE | Admitting: Physician Assistant

## 2016-01-15 VITALS — BP 102/78 | HR 67 | Temp 97.7°F | Resp 16 | Ht 63.0 in | Wt 122.2 lb

## 2016-01-15 DIAGNOSIS — Z0000 Encounter for general adult medical examination without abnormal findings: Secondary | ICD-10-CM | POA: Diagnosis not present

## 2016-01-15 DIAGNOSIS — F329 Major depressive disorder, single episode, unspecified: Secondary | ICD-10-CM | POA: Diagnosis not present

## 2016-01-15 DIAGNOSIS — I10 Essential (primary) hypertension: Secondary | ICD-10-CM

## 2016-01-15 DIAGNOSIS — F32A Depression, unspecified: Secondary | ICD-10-CM

## 2016-01-15 DIAGNOSIS — E785 Hyperlipidemia, unspecified: Secondary | ICD-10-CM | POA: Diagnosis not present

## 2016-01-15 LAB — COMPREHENSIVE METABOLIC PANEL
ALK PHOS: 99 U/L (ref 39–117)
ALT: 8 U/L (ref 0–35)
AST: 13 U/L (ref 0–37)
Albumin: 4.1 g/dL (ref 3.5–5.2)
BUN: 15 mg/dL (ref 6–23)
CO2: 27 mEq/L (ref 19–32)
Calcium: 9.3 mg/dL (ref 8.4–10.5)
Chloride: 98 mEq/L (ref 96–112)
Creatinine, Ser: 0.8 mg/dL (ref 0.40–1.20)
GFR: 78.14 mL/min (ref >60.00)
GLUCOSE: 71 mg/dL (ref 70–99)
POTASSIUM: 4.6 meq/L (ref 3.5–5.1)
SODIUM: 131 meq/L — AB (ref 135–145)
TOTAL PROTEIN: 7.3 g/dL (ref 6.0–8.3)
Total Bilirubin: 0.3 mg/dL (ref 0.2–1.2)

## 2016-01-15 LAB — URINALYSIS, ROUTINE W REFLEX MICROSCOPIC
Bilirubin Urine: NEGATIVE
Ketones, ur: NEGATIVE
Leukocytes, UA: NEGATIVE
Nitrite: NEGATIVE
PH: 6.5 (ref 5.0–8.0)
RBC / HPF: NONE SEEN (ref 0–?)
Total Protein, Urine: NEGATIVE
UROBILINOGEN UA: 0.2 (ref 0.0–1.0)
Urine Glucose: NEGATIVE

## 2016-01-15 LAB — CBC
HEMATOCRIT: 41.7 % (ref 36.0–46.0)
Hemoglobin: 14.1 g/dL (ref 12.0–15.0)
MCHC: 33.9 g/dL (ref 30.0–36.0)
MCV: 85.2 fl (ref 78.0–100.0)
Platelets: 345 10*3/uL (ref 150.0–400.0)
RBC: 4.9 Mil/uL (ref 3.87–5.11)
RDW: 14.6 % (ref 11.5–15.5)
WBC: 6.2 10*3/uL (ref 4.0–10.5)

## 2016-01-15 LAB — LIPID PANEL
CHOL/HDL RATIO: 3
Cholesterol: 233 mg/dL — ABNORMAL HIGH (ref 0–200)
HDL: 66.9 mg/dL (ref >39.00)
LDL CALC: 147 mg/dL — AB (ref 0–99)
NONHDL: 166.4
Triglycerides: 97 mg/dL (ref 0.0–149.0)
VLDL: 19.4 mg/dL (ref 0.0–40.0)

## 2016-01-15 LAB — TSH: TSH: 2.85 u[IU]/mL (ref 0.35–4.50)

## 2016-01-15 LAB — HEMOGLOBIN A1C: Hgb A1c MFr Bld: 5.6 % (ref 4.6–6.5)
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
## 2016-01-15 NOTE — Progress Notes (Signed)
Pre visit review using our clinic review tool, if applicable. No additional management support is needed unless otherwise documented below in the visit note/SLS  

## 2016-01-15 NOTE — Assessment & Plan Note (Signed)
Depression screen negative. Health Maintenance reviewed -- Declines TDaP and Hep C screen. Endorses prior negative HIV testing. Denies concern warranting repeat testing. Declines mammogram for financial reasons. Encouraged her to speak with her insurance rep to verify coverage for screening mammogram. [facility] will also [facility] assistance for mammograms. Preventive schedule discussed and handout given in AVS. Will obtain fasting labs today.

## 2016-01-15 NOTE — Assessment & Plan Note (Signed)
BP well controlled. Continue current regimen. Will check labs today.

## 2016-01-15 NOTE — Assessment & Plan Note (Signed)
Will repeat fasting lipid panel today. Discussed diet and exercise.

## 2016-01-15 NOTE — Patient Instructions (Signed)
Please go to the lab for blood work.   Our office will call you with your results unless you have chosen to receive results via MyChart.  If your blood work is normal we will follow-up each year for physicals and as scheduled for chronic medical problems.  If anything is abnormal we will treat accordingly and get you in for a follow-up.  Continue chronic medications as directed.  I will check on getting a pro-bono mammogram for you.  Also check with your insurance as they should cover a screening mammogram for you.  If/When you reconsider stopping smoking, please give me a call.  Preventive Care for Adults, Female A healthy lifestyle and preventive care can promote health and wellness. Preventive health guidelines for women include the following key practices.  A routine yearly physical is a good way to check with your health care provider about your health and preventive screening. It is a chance to share any concerns and updates on your health and to receive a thorough exam.  Visit your dentist for a routine exam and preventive care every 6 months. Brush your teeth twice a day and floss once a day. Good oral hygiene prevents tooth decay and gum disease.  The frequency of eye exams is based on your age, health, family medical history, use of contact lenses, and other factors. Follow your health care provider's recommendations for frequency of eye exams.  Eat a healthy diet. Foods like vegetables, fruits, whole grains, low-fat dairy products, and lean protein foods contain the nutrients you need without too many calories. Decrease your intake of foods high in solid fats, added sugars, and salt. Eat the right amount of calories for you.Get information about a proper diet from your health care provider, if necessary.  Regular physical exercise is one of the most important things you can do for your health. Most adults should get at least 150 minutes of moderate-intensity exercise (any  activity that increases your heart rate and causes you to sweat) each week. In addition, most adults need muscle-strengthening exercises on 2 or more days a week.  Maintain a healthy weight. The body mass index (BMI) is a screening tool to identify possible weight problems. It provides an estimate of body fat based on height and weight. Your health care provider can find your BMI and can help you achieve or maintain a healthy weight.For adults 20 years and older:  A BMI below 18.5 is considered underweight.  A BMI of 18.5 to 24.9 is normal.  A BMI of 25 to 29.9 is considered overweight.  A BMI of 30 and above is considered obese.  Maintain normal blood lipids and cholesterol levels by exercising and minimizing your intake of saturated fat. Eat a balanced diet with plenty of fruit and vegetables. Blood tests for lipids and cholesterol should begin at age 53 and be repeated every 5 years. If your lipid or cholesterol levels are high, you are over 50, or you are at high risk for heart disease, you may need your cholesterol levels checked more frequently.Ongoing high lipid and cholesterol levels should be treated with medicines if diet and exercise are not working.  If you smoke, find out from your health care provider how to quit. If you do not use tobacco, do not start.  Lung cancer screening is recommended for adults aged 10-80 years who are at high risk for developing lung cancer because of a history of smoking. A yearly low-dose CT scan of the lungs is recommended  for people who have at least a 30-pack-year history of smoking and are a current smoker or have quit within the past 15 years. A pack year of smoking is smoking an average of 1 pack of cigarettes a day for 1 year (for example: 1 pack a day for 30 years or 2 packs a day for 15 years). Yearly screening should continue until the smoker has stopped smoking for at least 15 years. Yearly screening should be stopped for people who develop a  health problem that would prevent them from having lung cancer treatment.  If you are pregnant, do not drink alcohol. If you are breastfeeding, be very cautious about drinking alcohol. If you are not pregnant and choose to drink alcohol, do not have more than 1 drink per day. One drink is considered to be 12 ounces (355 mL) of beer, 5 ounces (148 mL) of wine, or 1.5 ounces (44 mL) of liquor.  Avoid use of street drugs. Do not share needles with anyone. Ask for help if you need support or instructions about stopping the use of drugs.  High blood pressure causes heart disease and increases the risk of stroke. Your blood pressure should be checked at least every 1 to 2 years. Ongoing high blood pressure should be treated with medicines if weight loss and exercise do not work.  If you are 29-54 years old, ask your health care provider if you should take aspirin to prevent strokes.  Diabetes screening is done by taking a blood sample to check your blood glucose level after you have not eaten for a certain period of time (fasting). If you are not overweight and you do not have risk factors for diabetes, you should be screened once every 3 years starting at age 3. If you are overweight or obese and you are 2-19 years of age, you should be screened for diabetes every year as part of your cardiovascular risk assessment.  Breast cancer screening is essential preventive care for women. You should practice "breast self-awareness." This means understanding the normal appearance and feel of your breasts and may include breast self-examination. Any changes detected, no matter how small, should be reported to a health care provider. Women in their 72s and 30s should have a clinical breast exam (CBE) by a health care provider as part of a regular health exam every 1 to 3 years. After age 103, women should have a CBE every year. Starting at age 83, women should consider having a mammogram (breast X-ray test) every year.  Women who have a family history of breast cancer should talk to their health care provider about genetic screening. Women at a high risk of breast cancer should talk to their health care providers about having an MRI and a mammogram every year.  Breast cancer gene (BRCA)-related cancer risk assessment is recommended for women who have family members with BRCA-related cancers. BRCA-related cancers include breast, ovarian, tubal, and peritoneal cancers. Having family members with these cancers may be associated with an increased risk for harmful changes (mutations) in the breast cancer genes BRCA1 and BRCA2. Results of the assessment will determine the need for genetic counseling and BRCA1 and BRCA2 testing.  Your health care provider may recommend that you be screened regularly for cancer of the pelvic organs (ovaries, uterus, and vagina). This screening involves a pelvic examination, including checking for microscopic changes to the surface of your cervix (Pap test). You may be encouraged to have this screening done every 3 years, beginning  at age 1.  For women ages 62-65, health care providers may recommend pelvic exams and Pap testing every 3 years, or they may recommend the Pap and pelvic exam, combined with testing for human papilloma virus (HPV), every 5 years. Some types of HPV increase your risk of cervical cancer. Testing for HPV may also be done on women of any age with unclear Pap test results.  Other health care providers may not recommend any screening for nonpregnant women who are considered low risk for pelvic cancer and who do not have symptoms. Ask your health care provider if a screening pelvic exam is right for you.  If you have had past treatment for cervical cancer or a condition that could lead to cancer, you need Pap tests and screening for cancer for at least 20 years after your treatment. If Pap tests have been discontinued, your risk factors (such as having a new sexual partner)  need to be reassessed to determine if screening should resume. Some women have medical problems that increase the chance of getting cervical cancer. In these cases, your health care provider may recommend more frequent screening and Pap tests.  Colorectal cancer can be detected and often prevented. Most routine colorectal cancer screening begins at the age of 68 years and continues through age 41 years. However, your health care provider may recommend screening at an earlier age if you have risk factors for colon cancer. On a yearly basis, your health care provider may provide home test kits to check for hidden blood in the stool. Use of a small camera at the end of a tube, to directly examine the colon (sigmoidoscopy or colonoscopy), can detect the earliest forms of colorectal cancer. Talk to your health care provider about this at age 27, when routine screening begins. Direct exam of the colon should be repeated every 5-10 years through age 42 years, unless early forms of precancerous polyps or small growths are found.  People who are at an increased risk for hepatitis B should be screened for this virus. You are considered at high risk for hepatitis B if:  You were born in a country where hepatitis B occurs often. Talk with your health care provider about which countries are considered high risk.  Your parents were born in a high-risk country and you have not received a shot to protect against hepatitis B (hepatitis B vaccine).  You have HIV or AIDS.  You use needles to inject street drugs.  You live with, or have sex with, someone who has hepatitis B.  You get hemodialysis treatment.  You take certain medicines for conditions like cancer, organ transplantation, and autoimmune conditions.  Hepatitis C blood testing is recommended for all people born from [birth year] through 1965 and any individual with known risks for hepatitis C.  Practice safe sex. Use condoms and avoid high-risk sexual  practices to reduce the spread of sexually transmitted infections (STIs). STIs include gonorrhea, chlamydia, syphilis, trichomonas, herpes, HPV, and human immunodeficiency virus (HIV). Herpes, HIV, and HPV are viral illnesses that have no cure. They can result in disability, cancer, and death.  You should be screened for sexually transmitted illnesses (STIs) including gonorrhea and chlamydia if:  You are sexually active and are younger than 24 years.  You are older than 24 years and your health care provider tells you that you are at risk for this type of infection.  Your sexual activity has changed since you were last screened and you are at an increased  risk for chlamydia or gonorrhea. Ask your health care provider if you are at risk.  If you are at risk of being infected with HIV, it is recommended that you take a prescription medicine daily to prevent HIV infection. This is called preexposure prophylaxis (PrEP). You are considered at risk if:  You are sexually active and do not regularly use condoms or know the HIV status of your partner(s).  You take drugs by injection.  You are sexually active with a partner who has HIV.  Talk with your health care provider about whether you are at high risk of being infected with HIV. If you choose to begin PrEP, you should first be tested for HIV. You should then be tested every 3 months for as long as you are taking PrEP.  Osteoporosis is a disease in which the bones lose minerals and strength with aging. This can result in serious bone fractures or breaks. The risk of osteoporosis can be identified using a bone density scan. Women ages 31 years and over and women at risk for fractures or osteoporosis should discuss screening with their health care providers. Ask your health care provider whether you should take a calcium supplement or vitamin D to reduce the rate of osteoporosis.  Menopause can be associated with physical symptoms and risks. Hormone  replacement therapy is available to decrease symptoms and risks. You should talk to your health care provider about whether hormone replacement therapy is right for you.  Use sunscreen. Apply sunscreen liberally and repeatedly throughout the day. You should seek shade when your shadow is shorter than you. Protect yourself by wearing long sleeves, pants, a wide-brimmed hat, and sunglasses year round, whenever you are outdoors.  Once a month, do a whole body skin exam, using a mirror to look at the skin on your back. Tell your health care provider of new moles, moles that have irregular borders, moles that are larger than a pencil eraser, or moles that have changed in shape or color.  Stay current with required vaccines (immunizations).  Influenza vaccine. All adults should be immunized every year.  Tetanus, diphtheria, and acellular pertussis (Td, Tdap) vaccine. Pregnant women should receive 1 dose of Tdap vaccine during each pregnancy. The dose should be obtained regardless of the length of time since the last dose. Immunization is preferred during the 27th-36th week of gestation. An adult who has not previously received Tdap or who does not know her vaccine status should receive 1 dose of Tdap. This initial dose should be followed by tetanus and diphtheria toxoids (Td) booster doses every 10 years. Adults with an unknown or incomplete history of completing a 3-dose immunization series with Td-containing vaccines should begin or complete a primary immunization series including a Tdap dose. Adults should receive a Td booster every 10 years.  Varicella vaccine. An adult without evidence of immunity to varicella should receive 2 doses or a second dose if she has previously received 1 dose. Pregnant females who do not have evidence of immunity should receive the first dose after pregnancy. This first dose should be obtained before leaving the health care facility. The second dose should be obtained 4-8 weeks  after the first dose.  Human papillomavirus (HPV) vaccine. Females aged 13-26 years who have not received the vaccine previously should obtain the 3-dose series. The vaccine is not recommended for use in pregnant females. However, pregnancy testing is not needed before receiving a dose. If a female is found to be pregnant after receiving  a dose, no treatment is needed. In that case, the remaining doses should be delayed until after the pregnancy. Immunization is recommended for any person with an immunocompromised condition through the age of 65 years if she did not get any or all doses earlier. During the 3-dose series, the second dose should be obtained 4-8 weeks after the first dose. The third dose should be obtained 24 weeks after the first dose and 16 weeks after the second dose.  Zoster vaccine. One dose is recommended for adults aged 33 years or older unless certain conditions are present.  Measles, mumps, and rubella (MMR) vaccine. Adults born before [birth year] generally are considered immune to measles and mumps. Adults born in [birth year] or later should have 1 or more doses of MMR vaccine unless there is a contraindication to the vaccine or there is laboratory evidence of immunity to each of the three diseases. A routine second dose of MMR vaccine should be obtained at least 28 days after the first dose for students attending postsecondary schools, health care workers, or international travelers. People who received inactivated measles vaccine or an unknown type of measles vaccine during 1963-1967 should receive 2 doses of MMR vaccine. People who received inactivated mumps vaccine or an unknown type of mumps vaccine before 1979 and are at high risk for mumps infection should consider immunization with 2 doses of MMR vaccine. For females of childbearing age, rubella immunity should be determined. If there is no evidence of immunity, females who are not pregnant should be vaccinated. If there is no evidence of  immunity, females who are pregnant should delay immunization until after pregnancy. Unvaccinated health care workers born before [birth year] who lack laboratory evidence of measles, mumps, or rubella immunity or laboratory confirmation of disease should consider measles and mumps immunization with 2 doses of MMR vaccine or rubella immunization with 1 dose of MMR vaccine.  Pneumococcal 13-valent conjugate (PCV13) vaccine. When indicated, a person who is uncertain of his immunization history and has no record of immunization should receive the PCV13 vaccine. All adults 75 years of age and older should receive this vaccine. An adult aged 73 years or older who has certain medical conditions and has not been previously immunized should receive 1 dose of PCV13 vaccine. This PCV13 should be followed with a dose of pneumococcal polysaccharide (PPSV23) vaccine. Adults who are at high risk for pneumococcal disease should obtain the PPSV23 vaccine at least 8 weeks after the dose of PCV13 vaccine. Adults older than 59 years of age who have normal immune system function should obtain the PPSV23 vaccine dose at least 1 year after the dose of PCV13 vaccine.  Pneumococcal polysaccharide (PPSV23) vaccine. When PCV13 is also indicated, PCV13 should be obtained first. All adults aged 39 years and older should be immunized. An adult younger than age 45 years who has certain medical conditions should be immunized. Any person who resides in a nursing home or long-term care facility should be immunized. An adult smoker should be immunized. People with an immunocompromised condition and certain other conditions should receive both PCV13 and PPSV23 vaccines. People with human immunodeficiency virus (HIV) infection should be immunized as soon as possible after diagnosis. Immunization during chemotherapy or radiation therapy should be avoided. Routine use of PPSV23 vaccine is not recommended for American Indians, [place] Natives, or people  younger than 65 years unless there are medical conditions that require PPSV23 vaccine. When indicated, people who have unknown immunization and have no record of immunization should receive  PPSV23 vaccine. One-time revaccination 5 years after the first dose of PPSV23 is recommended for people aged 19-64 years who have chronic kidney failure, nephrotic syndrome, asplenia, or immunocompromised conditions. People who received 1-2 doses of PPSV23 before age 65 years should receive another dose of PPSV23 vaccine at age 67 years or later if at least 5 years have passed since the previous dose. Doses of PPSV23 are not needed for people immunized with PPSV23 at or after age 71 years.  Meningococcal vaccine. Adults with asplenia or persistent complement component deficiencies should receive 2 doses of quadrivalent meningococcal conjugate (MenACWY-D) vaccine. The doses should be obtained at least 2 months apart. Microbiologists working with certain meningococcal bacteria, [employer] recruits, people at risk during an outbreak, and people who travel to or live in countries with a high rate of meningitis should be immunized. A first-year college student up through age 56 years who is living in a residence hall should receive a dose if she did not receive a dose on or after her 16th birthday. Adults who have certain high-risk conditions should receive one or more doses of vaccine.  Hepatitis A vaccine. Adults who wish to be protected from this disease, have certain high-risk conditions, work with hepatitis A-infected animals, work in hepatitis A research labs, or travel to or work in countries with a high rate of hepatitis A should be immunized. Adults who were previously unvaccinated and who anticipate close contact with an international adoptee during the first 60 days after arrival in the [place] from a country with a high rate of hepatitis A should be immunized.  Hepatitis B vaccine. Adults who wish to be protected  from this disease, have certain high-risk conditions, may be exposed to blood or other infectious body fluids, are household contacts or sex partners of hepatitis B positive people, are clients or workers in certain care facilities, or travel to or work in countries with a high rate of hepatitis B should be immunized.  Haemophilus influenzae type b (Hib) vaccine. A previously unvaccinated person with asplenia or sickle cell disease or having a scheduled splenectomy should receive 1 dose of Hib vaccine. Regardless of previous immunization, a recipient of a hematopoietic stem cell transplant should receive a 3-dose series 6-12 months after her successful transplant. Hib vaccine is not recommended for adults with HIV infection. Preventive Services / Frequency Ages 39 to 60 years  Blood pressure check.** / Every 3-5 years.  Lipid and cholesterol check.** / Every 5 years beginning at age 30.  Clinical breast exam.** / Every 3 years for women in their 54s and 38s.  BRCA-related cancer risk assessment.** / For women who have family members with a BRCA-related cancer (breast, ovarian, tubal, or peritoneal cancers).  Pap test.** / Every 2 years from ages 34 through 10. Every 3 years starting at age 77 through age 11 or 33 with a history of 3 consecutive normal Pap tests.  HPV screening.** / Every 3 years from ages 66 through ages 72 to 74 with a history of 3 consecutive normal Pap tests.  Hepatitis C blood test.** / For any individual with known risks for hepatitis C.  Skin self-exam. / Monthly.  Influenza vaccine. / Every year.  Tetanus, diphtheria, and acellular pertussis (Tdap, Td) vaccine.** / Consult your health care provider. Pregnant women should receive 1 dose of Tdap vaccine during each pregnancy. 1 dose of Td every 10 years.  Varicella vaccine.** / Consult your health care provider. Pregnant females who do not have  evidence of immunity should receive the first dose after pregnancy.  HPV  vaccine. / 3 doses over 6 months, if 83 and younger. The vaccine is not recommended for use in pregnant females. However, pregnancy testing is not needed before receiving a dose.  Measles, mumps, rubella (MMR) vaccine.** / You need at least 1 dose of MMR if you were born in 1957 or later. You may also need a 2nd dose. For females of childbearing age, rubella immunity should be determined. If there is no evidence of immunity, females who are not pregnant should be vaccinated. If there is no evidence of immunity, females who are pregnant should delay immunization until after pregnancy.  Pneumococcal 13-valent conjugate (PCV13) vaccine.** / Consult your health care provider.  Pneumococcal polysaccharide (PPSV23) vaccine.** / 1 to 2 doses if you smoke cigarettes or if you have certain conditions.  Meningococcal vaccine.** / 1 dose if you are age 18 to 45 years and a Market researcher living in a residence hall, or have one of several medical conditions, you need to get vaccinated against meningococcal disease. You may also need additional booster doses.  Hepatitis A vaccine.** / Consult your health care provider.  Hepatitis B vaccine.** / Consult your health care provider.  Haemophilus influenzae type b (Hib) vaccine.** / Consult your health care provider. Ages 47 to 65 years  Blood pressure check.** / Every year.  Lipid and cholesterol check.** / Every 5 years beginning at age 57 years.  Lung cancer screening. / Every year if you are aged 25-80 years and have a 30-pack-year history of smoking and currently smoke or have quit within the past 15 years. Yearly screening is stopped once you have quit smoking for at least 15 years or develop a health problem that would prevent you from having lung cancer treatment.  Clinical breast exam.** / Every year after age 45 years.  BRCA-related cancer risk assessment.** / For women who have family members with a BRCA-related cancer (breast,  ovarian, tubal, or peritoneal cancers).  Mammogram.** / Every year beginning at age 74 years and continuing for as long as you are in good health. Consult with your health care provider.  Pap test.** / Every 3 years starting at age 64 years through age 63 or 25 years with a history of 3 consecutive normal Pap tests.  HPV screening.** / Every 3 years from ages 20 years through ages 31 to 55 years with a history of 3 consecutive normal Pap tests.  Fecal occult blood test (FOBT) of stool. / Every year beginning at age 66 years and continuing until age 41 years. You may not need to do this test if you get a colonoscopy every 10 years.  Flexible sigmoidoscopy or colonoscopy.** / Every 5 years for a flexible sigmoidoscopy or every 10 years for a colonoscopy beginning at age 69 years and continuing until age 49 years.  Hepatitis C blood test.** / For all people born from [birth year] through 1964 and any individual with known risks for hepatitis C.  Skin self-exam. / Monthly.  Influenza vaccine. / Every year.  Tetanus, diphtheria, and acellular pertussis (Tdap/Td) vaccine.** / Consult your health care provider. Pregnant women should receive 1 dose of Tdap vaccine during each pregnancy. 1 dose of Td every 10 years.  Varicella vaccine.** / Consult your health care provider. Pregnant females who do not have evidence of immunity should receive the first dose after pregnancy.  Zoster vaccine.** / 1 dose for adults aged 27 years or older.  Measles, mumps, rubella (MMR) vaccine.** / You need at least 1 dose of MMR if you were born in 1957 or later. You may also need a second dose. For females of childbearing age, rubella immunity should be determined. If there is no evidence of immunity, females who are not pregnant should be vaccinated. If there is no evidence of immunity, females who are pregnant should delay immunization until after pregnancy.  Pneumococcal 13-valent conjugate (PCV13) vaccine.** / Consult  your health care provider.  Pneumococcal polysaccharide (PPSV23) vaccine.** / 1 to 2 doses if you smoke cigarettes or if you have certain conditions.  Meningococcal vaccine.** / Consult your health care provider.  Hepatitis A vaccine.** / Consult your health care provider.  Hepatitis B vaccine.** / Consult your health care provider.  Haemophilus influenzae type b (Hib) vaccine.** / Consult your health care provider. Ages 44 years and over  Blood pressure check.** / Every year.  Lipid and cholesterol check.** / Every 5 years beginning at age 62 years.  Lung cancer screening. / Every year if you are aged 37-80 years and have a 30-pack-year history of smoking and currently smoke or have quit within the past 15 years. Yearly screening is stopped once you have quit smoking for at least 15 years or develop a health problem that would prevent you from having lung cancer treatment.  Clinical breast exam.** / Every year after age 19 years.  BRCA-related cancer risk assessment.** / For women who have family members with a BRCA-related cancer (breast, ovarian, tubal, or peritoneal cancers).  Mammogram.** / Every year beginning at age 14 years and continuing for as long as you are in good health. Consult with your health care provider.  Pap test.** / Every 3 years starting at age 39 years through age 61 or 63 years with 3 consecutive normal Pap tests. Testing can be stopped between 65 and 70 years with 3 consecutive normal Pap tests and no abnormal Pap or HPV tests in the past 10 years.  HPV screening.** / Every 3 years from ages 64 years through ages 84 or 53 years with a history of 3 consecutive normal Pap tests. Testing can be stopped between 65 and 70 years with 3 consecutive normal Pap tests and no abnormal Pap or HPV tests in the past 10 years.  Fecal occult blood test (FOBT) of stool. / Every year beginning at age 48 years and continuing until age 59 years. You may not need to do this test  if you get a colonoscopy every 10 years.  Flexible sigmoidoscopy or colonoscopy.** / Every 5 years for a flexible sigmoidoscopy or every 10 years for a colonoscopy beginning at age 66 years and continuing until age 67 years.  Hepatitis C blood test.** / For all people born from [birth year] through 1964 and any individual with known risks for hepatitis C.  Osteoporosis screening.** / A one-time screening for women ages 82 years and over and women at risk for fractures or osteoporosis.  Skin self-exam. / Monthly.  Influenza vaccine. / Every year.  Tetanus, diphtheria, and acellular pertussis (Tdap/Td) vaccine.** / 1 dose of Td every 10 years.  Varicella vaccine.** / Consult your health care provider.  Zoster vaccine.** / 1 dose for adults aged 105 years or older.  Pneumococcal 13-valent conjugate (PCV13) vaccine.** / Consult your health care provider.  Pneumococcal polysaccharide (PPSV23) vaccine.** / 1 dose for all adults aged 26 years and older.  Meningococcal vaccine.** / Consult your health care provider.  Hepatitis A vaccine.** /  Consult your health care provider.  Hepatitis B vaccine.** / Consult your health care provider.  Haemophilus influenzae type b (Hib) vaccine.** / Consult your health care provider. ** Family history and personal history of risk and conditions may change your health care provider's recommendations.   This information is not intended to replace advice given to you by your health care provider. Make sure you discuss any questions you have with your health care provider.   Document Released: 08/11/2001 Document Revised: 07/06/2014 Document Reviewed: 11/10/2010 Elsevier Interactive Patient Education 2016 [employer].    Smoking Cessation, Tips for Success If you are ready to quit smoking, congratulations! You have chosen to help yourself be healthier. Cigarettes bring nicotine, tar, carbon monoxide, and other irritants into your body. Your lungs, heart, and  blood vessels will be able to work better without these poisons. There are many different ways to quit smoking. Nicotine gum, nicotine patches, a nicotine inhaler, or nicotine nasal spray can help with physical craving. Hypnosis, support groups, and medicines help break the habit of smoking. WHAT THINGS CAN I DO TO MAKE QUITTING EASIER?  Here are some tips to help you quit for good:  Pick a date when you will quit smoking completely. Tell all of your friends and family about your plan to quit on that date.  Do not try to slowly cut down on the number of cigarettes you are smoking. Pick a quit date and quit smoking completely starting on that day.  Throw away all cigarettes.   Clean and remove all ashtrays from your home, work, and car.  On a card, write down your reasons for quitting. Carry the card with you and read it when you get the urge to smoke.  Cleanse your body of nicotine. Drink enough water and fluids to keep your urine clear or pale yellow. Do this after quitting to flush the nicotine from your body.  Learn to predict your moods. Do not let a bad situation be your excuse to have a cigarette. Some situations in your life might tempt you into wanting a cigarette.  Never have "just one" cigarette. It leads to wanting another and another. Remind yourself of your decision to quit.  Change habits associated with smoking. If you smoked while driving or when feeling stressed, try other activities to replace smoking. Stand up when drinking your coffee. Brush your teeth after eating. Sit in a different chair when you read the paper. Avoid alcohol while trying to quit, and try to drink fewer caffeinated beverages. Alcohol and caffeine may urge you to smoke.  Avoid foods and drinks that can trigger a desire to smoke, such as sugary or spicy foods and alcohol.  Ask people who smoke not to smoke around you.  Have something planned to do right after eating or having a cup of coffee. For  example, plan to take a walk or exercise.  Try a relaxation exercise to calm you down and decrease your stress. Remember, you may be tense and nervous for the first 2 weeks after you quit, but this will pass.  Find new activities to keep your hands busy. Play with a pen, coin, or rubber band. Doodle or draw things on paper.  Brush your teeth right after eating. This will help cut down on the craving for the taste of tobacco after meals. You can also try mouthwash.   Use oral substitutes in place of cigarettes. Try using lemon drops, carrots, cinnamon sticks, or chewing gum. Keep them handy  so they are available when you have the urge to smoke.  When you have the urge to smoke, try deep breathing.  Designate your home as a nonsmoking area.  If you are a heavy smoker, ask your health care provider about a prescription for nicotine chewing gum. It can ease your withdrawal from nicotine.  Reward yourself. Set aside the cigarette money you save and buy yourself something nice.  Look for support from others. Join a support group or smoking cessation program. Ask someone at home or at work to help you with your plan to quit smoking.  Always ask yourself, "Do I need this cigarette or is this just a reflex?" Tell yourself, "Today, I choose not to smoke," or "I do not want to smoke." You are reminding yourself of your decision to quit.  Do not replace cigarette smoking with electronic cigarettes (commonly called e-cigarettes). The safety of e-cigarettes is unknown, and some may contain harmful chemicals.  If you relapse, do not give up! Plan ahead and think about what you will do the next time you get the urge to smoke. HOW WILL I FEEL WHEN I QUIT SMOKING? You may have symptoms of withdrawal because your body is used to nicotine (the addictive substance in cigarettes). You may crave cigarettes, be irritable, feel very hungry, cough often, get headaches, or have difficulty concentrating. The  withdrawal symptoms are only temporary. They are strongest when you first quit but will go away within 10-14 days. When withdrawal symptoms occur, stay in control. Think about your reasons for quitting. Remind yourself that these are signs that your body is healing and getting used to being without cigarettes. Remember that withdrawal symptoms are easier to treat than the major diseases that smoking can cause.  Even after the withdrawal is over, expect periodic urges to smoke. However, these cravings are generally short lived and will go away whether you smoke or not. Do not smoke! WHAT RESOURCES ARE AVAILABLE TO HELP ME QUIT SMOKING? Your health care provider can direct you to community resources or hospitals for support, which may include:  Group support.  Education.  Hypnosis.  Therapy.   This information is not intended to replace advice given to you by your health care provider. Make sure you discuss any questions you have with your health care provider.   Document Released: 03/13/2004 Document Revised: 07/06/2014 Document Reviewed: 12/01/2012 Elsevier Interactive Patient Education [employer].

## 2016-01-15 NOTE — Assessment & Plan Note (Signed)
Well-controlled.  Continue current regimen. 

## 2016-01-17 ENCOUNTER — Telehealth: Payer: Self-pay | Admitting: *Deleted

## 2016-01-17 NOTE — Telephone Encounter (Signed)
Patient returning your call.

## 2016-01-17 NOTE — Telephone Encounter (Signed)
-----   Message from Waldon MerlWilliam C Martin, PA-C sent at 01/17/2016 12:27 PM EDT ----- Labs results are in. Look great overall. Cholesterol is borderline elevated with mild elevation in LDL (147). No medication needed at present but recommend increased aerobic activity and diet low in saturated fats and cholesterol. Sodium was decreased slightly. Want her to add on a daily G2 gatorade or Propel water. Repeat BMP in 1 week.  Needs lab order placed and lab appt scheduled.

## 2016-01-17 NOTE — Telephone Encounter (Signed)
LMOM with contact name and number for return call RE: results and further provider instructions/SLS 07/21

## 2016-01-20 NOTE — Telephone Encounter (Signed)
LMOM [2nd] with contact name and number for return call RE: results and further provider instructions/SLS 07/24

## 2016-01-23 ENCOUNTER — Other Ambulatory Visit: Payer: Self-pay | Admitting: Physician Assistant

## 2016-01-23 DIAGNOSIS — E871 Hypo-osmolality and hyponatremia: Secondary | ICD-10-CM

## 2016-01-23 NOTE — Telephone Encounter (Signed)
Called and spoke to the patient.  Did inform of her lab results/instructions.  She did verbalize understanding and agreed to instructions  She could not schedule lab appointment at the time as was working.  I did enter the order in EPIC for repeat BMP, she will call back to schedule once able to do so.  She will do Provider instructions.  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 And will continue.

## 2016-01-27 ENCOUNTER — Telehealth: Payer: Self-pay | Admitting: Physician Assistant

## 2016-01-27 MED ORDER — CITALOPRAM HYDROBROMIDE 20 MG PO TABS: ORAL_TABLET | ORAL | 0 refills | Status: DC

## 2016-01-27 NOTE — Telephone Encounter (Signed)
Refill sent per [facility] refill protocol/SLS  

## 2016-01-27 NOTE — Telephone Encounter (Signed)
Relation to OE:HOZY Call back number:[phone] Pharmacy: [id number] - [place], [place] - [address] AT [facility] [address] [phone] (Phone) [phone] (Fax)     Reason for call:  Patient requesting a refill citalopram (CELEXA) 20 MG tablet

## 2016-03-02 IMAGING — CR DG CHEST 2V
2 series · 2 of 2 positions shown · non-contrast
Comparison: 05/09/2007

CLINICAL DATA: Routine general medical examination at a health care
facility. Smoker.

EXAM:
CHEST  2 VIEW

[view not recorded (1 of 2)]
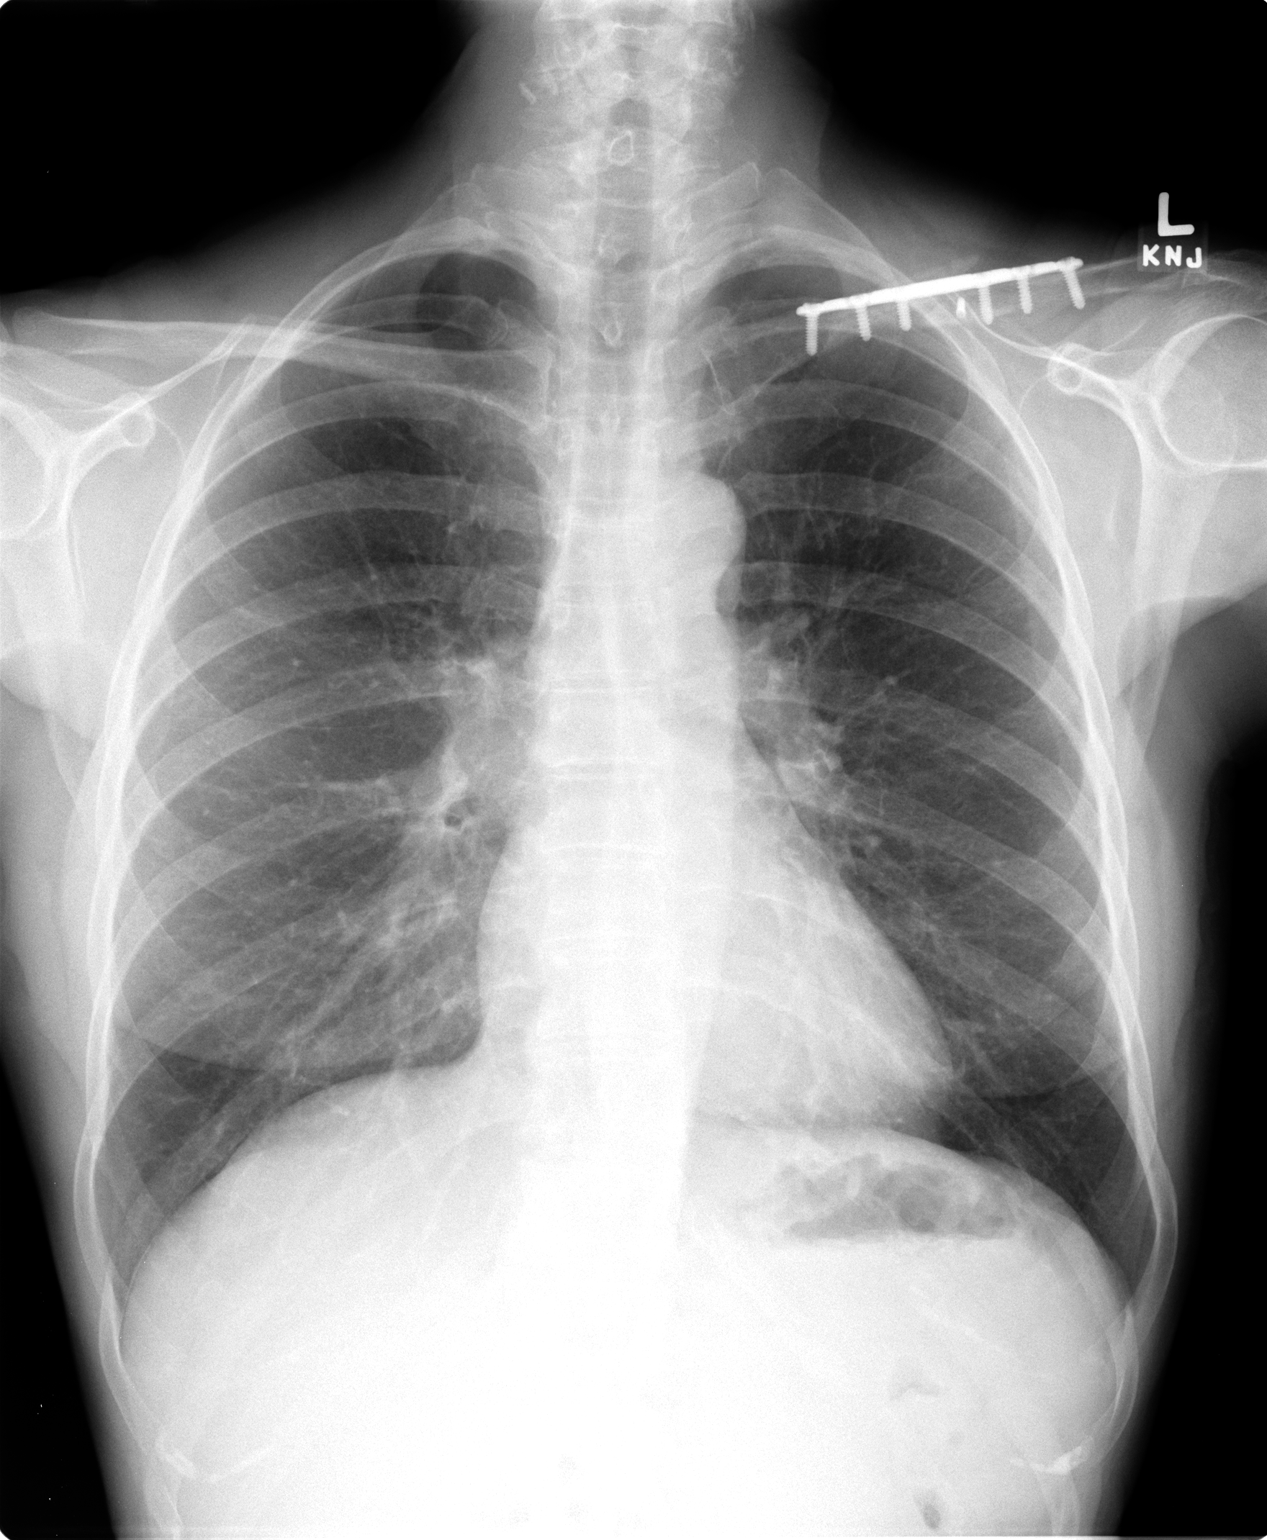

[view not recorded (2 of 2)]
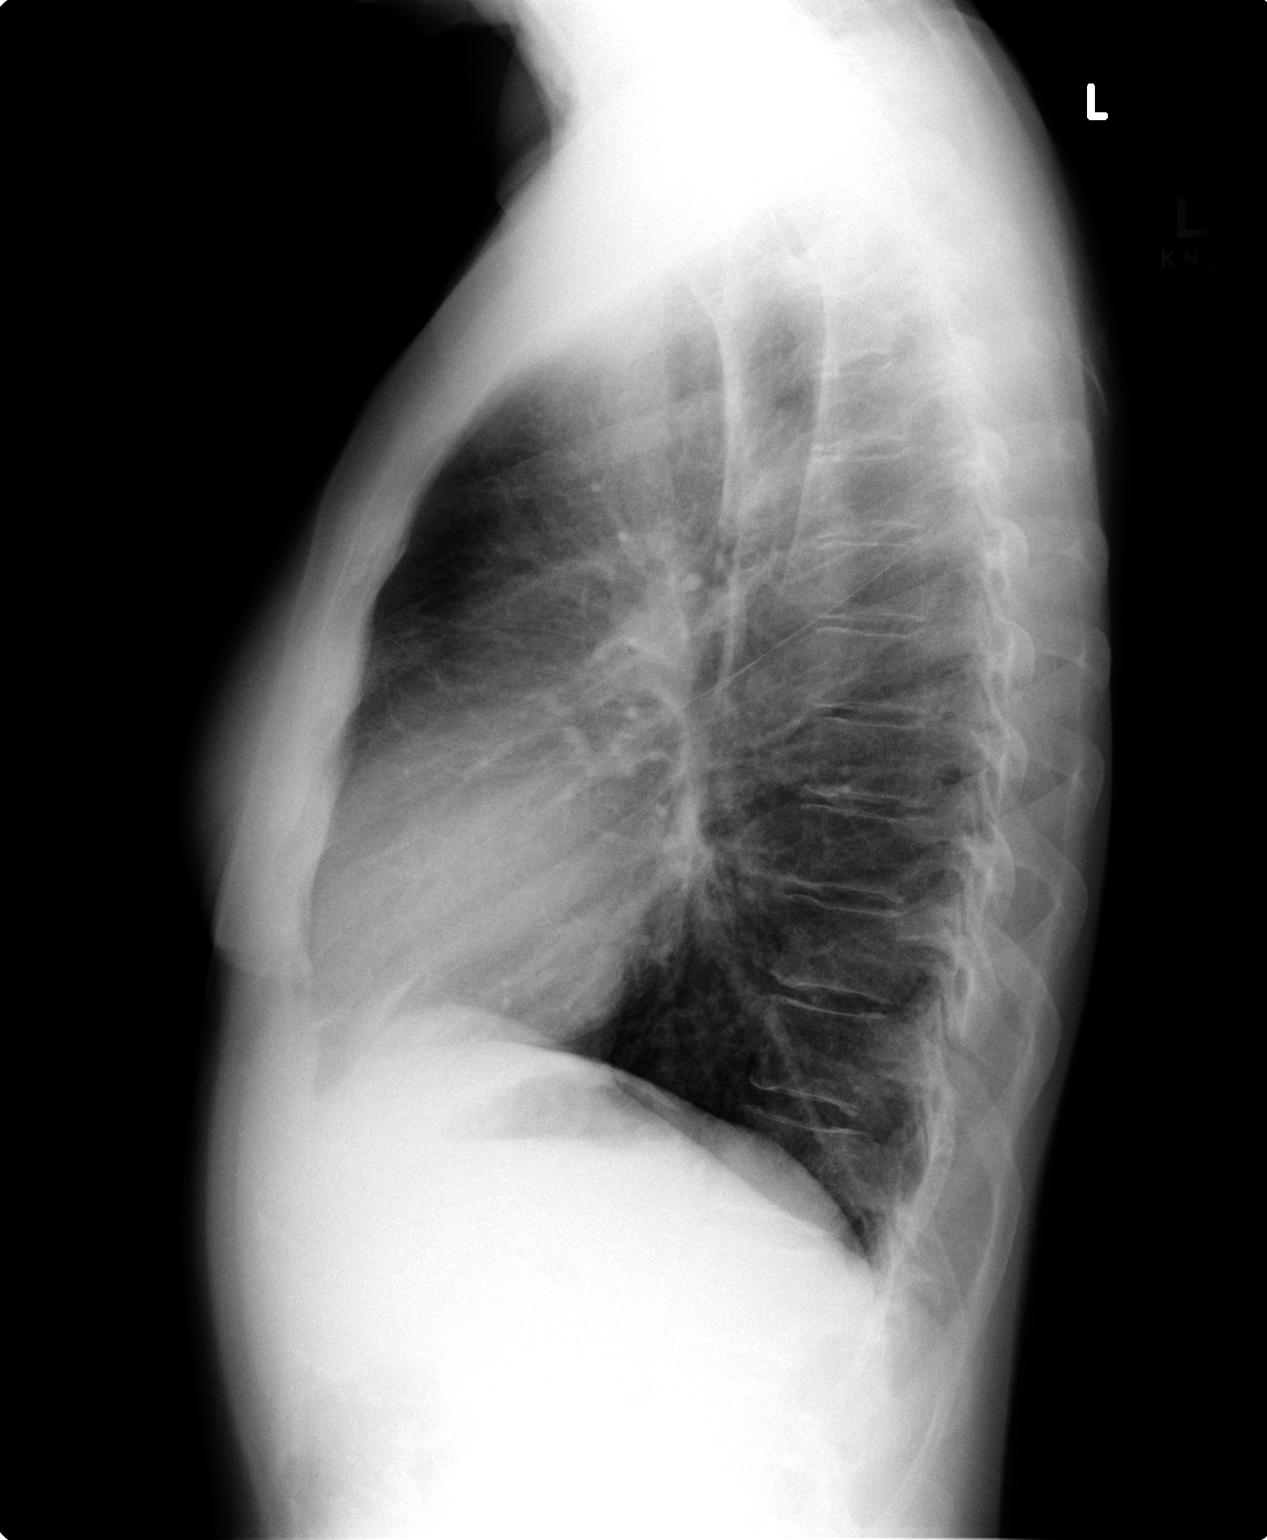

[2 of 2 positions shown; findings below may reference images not displayed]

FINDINGS: Heart size and pulmonary vascularity are normal. No acute
infiltrates or effusions. Slight chronic peribronchial thickening.
Old left clavicle fracture.
IMPRESSION: Chronic bronchitic changes.

## 2016-03-03 ENCOUNTER — Telehealth: Payer: Self-pay | Admitting: Physician Assistant

## 2016-03-03 NOTE — Telephone Encounter (Signed)
Relation to ZO:XWRUpt:self Call back number:[phone] Pharmacy: [id number] - [place] - [address] AT [facility] [address] [phone] (Phone) [phone] (Fax)     Reason for call:  Patient requesting refills for atenolol (TENORMIN) 50 MG tablet and lisinopril (PRINIVIL,ZESTRIL) 40 MG tablet until 07/17/2016. Patient states she doesn't want to call monthly requesting refills.

## 2016-03-03 NOTE — Telephone Encounter (Addendum)
Last Rx written for requested medications was on 01/08/16, #45x0 [90-day supply]; too soon for refill request; called pharmacy to verify last fill date [01/13/16 p/u #45, 3-mth supply]-Too Soon for request. Patient informed, understood & agreed/SLS 09/05

## 2016-04-02 ENCOUNTER — Telehealth: Payer: Self-pay | Admitting: Physician Assistant

## 2016-04-02 ENCOUNTER — Other Ambulatory Visit: Payer: Self-pay

## 2016-04-02 MED ORDER — LISINOPRIL 40 MG PO TABS: 20.0000 mg | ORAL_TABLET | Freq: Every day | ORAL | 0 refills | Status: DC

## 2016-04-02 MED ORDER — ATENOLOL 50 MG PO TABS: 25.0000 mg | ORAL_TABLET | Freq: Every day | ORAL | 0 refills | Status: DC

## 2016-04-02 NOTE — Telephone Encounter (Signed)
Caller name: Relationship to patient: Self  Can be reached: [phone]  Pharmacy:  [facility] - [place], [place] - [address] [phone] (Phone) [phone] (Fax)     Reason for call: Request refills on atenolol (TENORMIN) 50 MG tablet [[id number]][[id number]] and lisinopril (PRINIVIL,ZESTRIL) 40 MG tablet [[id number]][[id number]]         Request an alternative to Atenolol

## 2016-04-03 NOTE — Telephone Encounter (Signed)
Spoke with patient to verify that she had received her medication [sent 04/02/16]; she does have her medications as they were sent to [facility] at patient request d/t [facility] not having the Atenolol in stock and not giving the patient the direction that she needed to receive her medications; pt was upset with pharmacy and very "impressed with how our office handles things" for her. Informed her that there has been a [employer] exercise psychologist[employer] shortage from the manufacturer on the Atenolol, but that our pharmacy does have it if she were to run into this issue again, pt understood and agreed/SLS 10/06

## 2016-04-16 ENCOUNTER — Other Ambulatory Visit: Payer: Self-pay | Admitting: Physician Assistant

## 2016-04-16 NOTE — Telephone Encounter (Signed)
Medication filled to pharmacy as requested.   

## 2016-05-12 ENCOUNTER — Other Ambulatory Visit: Payer: Self-pay | Admitting: *Deleted

## 2016-05-12 MED ORDER — CITALOPRAM HYDROBROMIDE 20 MG PO TABS: ORAL_TABLET | ORAL | 0 refills | Status: AC

## 2016-05-12 NOTE — Progress Notes (Signed)
Refill sent per [facility] refill protocol/SLS  

## 2016-06-18 ENCOUNTER — Other Ambulatory Visit: Payer: Self-pay | Admitting: Physician Assistant

## 2016-07-17 ENCOUNTER — Ambulatory Visit: Payer: PRIVATE HEALTH INSURANCE | Admitting: Physician Assistant

## 2016-08-04 ENCOUNTER — Ambulatory Visit: Payer: PRIVATE HEALTH INSURANCE | Admitting: Physician Assistant
